# Patient Record
Sex: Male | Born: 1975
Health system: Southern US, Community
[De-identification: ages and names within clinical notes are randomized; demographics above are authoritative.]

## PROBLEM LIST (undated history)

## (undated) DIAGNOSIS — F32A Depression, unspecified: Secondary | ICD-10-CM

## (undated) DIAGNOSIS — G2581 Restless legs syndrome: Secondary | ICD-10-CM

## (undated) DIAGNOSIS — D497 Neoplasm of unspecified behavior of endocrine glands and other parts of nervous system: Secondary | ICD-10-CM

## (undated) DIAGNOSIS — E785 Hyperlipidemia, unspecified: Secondary | ICD-10-CM

## (undated) DIAGNOSIS — D352 Benign neoplasm of pituitary gland: Secondary | ICD-10-CM

## (undated) DIAGNOSIS — F329 Major depressive disorder, single episode, unspecified: Secondary | ICD-10-CM

## (undated) DIAGNOSIS — R000 Tachycardia, unspecified: Secondary | ICD-10-CM

## (undated) DIAGNOSIS — E349 Endocrine disorder, unspecified: Secondary | ICD-10-CM

## (undated) DIAGNOSIS — F324 Major depressive disorder, single episode, in partial remission: Secondary | ICD-10-CM

## (undated) HISTORY — DX: Hyperlipidemia, unspecified: E78.5

## (undated) HISTORY — DX: Major depressive disorder, single episode, in partial remission: F32.4

## (undated) HISTORY — DX: Major depressive disorder, single episode, unspecified: F32.9

## (undated) HISTORY — DX: Depression, unspecified: F32.A

## (undated) HISTORY — DX: Neoplasm of unspecified behavior of endocrine glands and other parts of nervous system: D49.7

## (undated) HISTORY — DX: Benign neoplasm of pituitary gland: D35.2

## (undated) HISTORY — DX: Restless legs syndrome: G25.81

## (undated) HISTORY — DX: Endocrine disorder, unspecified: E34.9

## (undated) HISTORY — PX: ANTERIOR CRUCIATE LIGAMENT REPAIR: SHX115

## (undated) HISTORY — DX: Tachycardia, unspecified: R00.0

## (undated) HISTORY — PX: LAPAROSCOPIC GASTRIC BANDING: SHX1100

## (undated) HISTORY — PX: TENDON REPAIR: SHX5111

---

## 2009-03-29 ENCOUNTER — Emergency Department: Payer: Self-pay | Admitting: Emergency Medicine

## 2009-04-08 ENCOUNTER — Ambulatory Visit: Payer: Self-pay | Admitting: Urology

## 2009-06-03 ENCOUNTER — Ambulatory Visit: Payer: Self-pay | Admitting: Urology

## 2010-02-27 ENCOUNTER — Emergency Department: Payer: Self-pay | Admitting: Emergency Medicine

## 2014-04-14 ENCOUNTER — Ambulatory Visit: Payer: Self-pay

## 2014-04-23 ENCOUNTER — Ambulatory Visit: Payer: Self-pay | Admitting: Physician Assistant

## 2014-06-04 DIAGNOSIS — D352 Benign neoplasm of pituitary gland: Secondary | ICD-10-CM | POA: Insufficient documentation

## 2014-06-04 HISTORY — DX: Benign neoplasm of pituitary gland: D35.2

## 2014-10-07 DIAGNOSIS — F329 Major depressive disorder, single episode, unspecified: Secondary | ICD-10-CM | POA: Insufficient documentation

## 2014-10-07 DIAGNOSIS — F32A Depression, unspecified: Secondary | ICD-10-CM | POA: Insufficient documentation

## 2014-11-26 DIAGNOSIS — R000 Tachycardia, unspecified: Secondary | ICD-10-CM | POA: Insufficient documentation

## 2014-11-26 HISTORY — DX: Tachycardia, unspecified: R00.0

## 2015-03-31 ENCOUNTER — Ambulatory Visit (INDEPENDENT_AMBULATORY_CARE_PROVIDER_SITE_OTHER): Payer: Managed Care, Other (non HMO) | Admitting: Family Medicine

## 2015-03-31 ENCOUNTER — Encounter: Payer: Self-pay | Admitting: Family Medicine

## 2015-03-31 ENCOUNTER — Other Ambulatory Visit: Payer: Self-pay

## 2015-03-31 VITALS — BP 124/84 | HR 88 | Ht 70.0 in | Wt 226.0 lb

## 2015-03-31 DIAGNOSIS — F329 Major depressive disorder, single episode, unspecified: Secondary | ICD-10-CM | POA: Diagnosis not present

## 2015-03-31 DIAGNOSIS — E663 Overweight: Secondary | ICD-10-CM

## 2015-03-31 DIAGNOSIS — I10 Essential (primary) hypertension: Secondary | ICD-10-CM | POA: Diagnosis not present

## 2015-03-31 DIAGNOSIS — Z87442 Personal history of urinary calculi: Secondary | ICD-10-CM

## 2015-03-31 DIAGNOSIS — Z23 Encounter for immunization: Secondary | ICD-10-CM

## 2015-03-31 DIAGNOSIS — Z8249 Family history of ischemic heart disease and other diseases of the circulatory system: Secondary | ICD-10-CM

## 2015-03-31 DIAGNOSIS — E291 Testicular hypofunction: Secondary | ICD-10-CM | POA: Diagnosis not present

## 2015-03-31 DIAGNOSIS — F32A Depression, unspecified: Secondary | ICD-10-CM

## 2015-03-31 DIAGNOSIS — G2581 Restless legs syndrome: Secondary | ICD-10-CM | POA: Insufficient documentation

## 2015-03-31 DIAGNOSIS — D352 Benign neoplasm of pituitary gland: Secondary | ICD-10-CM | POA: Diagnosis not present

## 2015-03-31 DIAGNOSIS — E782 Mixed hyperlipidemia: Secondary | ICD-10-CM | POA: Insufficient documentation

## 2015-03-31 DIAGNOSIS — E669 Obesity, unspecified: Secondary | ICD-10-CM | POA: Insufficient documentation

## 2015-03-31 DIAGNOSIS — E785 Hyperlipidemia, unspecified: Secondary | ICD-10-CM

## 2015-03-31 DIAGNOSIS — E349 Endocrine disorder, unspecified: Secondary | ICD-10-CM

## 2015-03-31 DIAGNOSIS — E66813 Obesity, class 3: Secondary | ICD-10-CM | POA: Insufficient documentation

## 2015-03-31 MED ORDER — SERTRALINE HCL 50 MG PO TABS: 50.0000 mg | ORAL_TABLET | Freq: Every day | ORAL | Status: DC

## 2015-03-31 NOTE — Progress Notes (Signed)
Date:  03/31/2015   Name:  Kyle Ortega.   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 Dx'd with pituitary microadenoma 05/2014 associated with hypotestosteronism only, monitoring only recommended, needs new endo due to insurance. On testosterone injections weekly with good levels 6 months ago. Severe RLS genrally well controlled with Mirapex but takes Lunesta prn when can't sleep. On Lipitor for yrs for HLD, last lipids one year ago. Hx depression on Wellbutrin for 1 yr and Zoloft for 3 months with good control, [facility] psych also not taking new insurance. Last tetanus 5 yrs ago. Had recent negative cardiac w/u for tachycardia. Hx kidney stones, none since switching off tap water. Reports some recent diarrhea he relates to stress, no recent abx exposure. Father took life due to severe CAD/PAD/depression.  Review of Systems:  Review of Systems  Constitutional: Negative for fever and chills.  Respiratory: Negative for shortness of breath.   Cardiovascular: Negative for chest pain and leg swelling.  Gastrointestinal: Negative for abdominal pain, blood in stool and abdominal distention.  Genitourinary: Negative for difficulty urinating.    Patient Active Problem List   Diagnosis Date Noted  . Hypotestosteronism 03/31/2015  . Overweight (BMI 25.0-29.9) 03/31/2015  . Restless leg syndrome 03/31/2015  . Hypertension 03/31/2015  . Hyperlipidemia 03/31/2015  . FH: heart disease 03/31/2015  . Family history of peripheral arterial disease 03/31/2015  . Clinical depression 10/07/2014  . Pituitary microadenoma ([place]) 06/04/2014    Prior to Admission medications   Medication Sig Start Date End Date Taking? Authorizing Provider  aspirin 81 MG chewable tablet Chew 1 tablet by  mouth daily. 10/21/09  Yes Historical Provider, MD  atorvastatin (LIPITOR) 40 MG tablet Take 1 tablet by mouth daily. 01/22/15  Yes Historical Provider, MD  benazepril-hydrochlorthiazide (LOTENSIN HCT) 20-25 MG tablet Take 2 tablets by mouth daily. 04/19/14  Yes Historical Provider, MD  buPROPion (WELLBUTRIN XL) 150 MG 24 hr tablet Take 2 tablets by mouth every morning.  03/02/15  Yes Historical Provider, MD  eszopiclone (LUNESTA) 2 MG TABS tablet Take 1 tablet by mouth at bedtime. As needed 03/02/15  Yes Historical Provider, MD  pramipexole (MIRAPEX) 1 MG tablet Take 1 tablet by mouth 2 (two) times daily. 01/22/15  Yes Historical Provider, MD  sertraline (ZOLOFT) 50 MG tablet Take 1 tablet (50 mg total) by mouth daily. 03/31/15  Yes Adline Potter, MD  testosterone cypionate (DEPOTESTOSTERONE CYPIONATE) 200 MG/ML injection Inject 0.5 mLs into the muscle once a week. 01/22/15  Yes Historical Provider, MD    Allergies  Allergen Reactions  . Shellfish Allergy   . Codeine Nausea Only    Past Surgical History  Procedure Laterality Date  . Laparoscopic gastric banding    . Anterior cruciate ligament repair Left   . Tendon repair Right     thumb    Social History  Substance Use Topics  . Smoking status: Former Research scientist (life sciences)  . Smokeless tobacco: None  . Alcohol Use: 0.0 oz/week    0 Standard drinks or equivalent per week    Family History  Problem Relation Age of Onset  . Diabetes Father   . Heart disease Father   . Stroke Father   . Hypertension Father     Medication list has been reviewed  and updated.  Physical Examination: BP 124/84 mmHg  Pulse 88  Ht 5\' 10"  (1.778 m)  Wt 226 lb (102.513 kg)  BMI 32.43 kg/m2  Physical Exam  Constitutional: He appears well-developed and well-nourished. No distress.  HENT:  Head: Normocephalic and atraumatic.  Right Ear: External ear normal.  Left Ear: External ear normal.  Nose: Nose normal.  Mouth/Throat: Oropharynx is clear and moist.  B TM's  clear  Eyes: Conjunctivae and EOM are normal. No scleral icterus.  Neck: Neck supple. No thyromegaly present.  Cardiovascular: Normal rate, regular rhythm and normal heart sounds.   Pulmonary/Chest: Effort normal and breath sounds normal.  Abdominal: Soft. He exhibits no distension and no mass. There is no tenderness.  Genitourinary: Penis normal.  Testes normal  Musculoskeletal: He exhibits no edema.  Lymphadenopathy:    He has no cervical adenopathy.  Neurological: He is alert. Coordination normal.  Strength 5/5 throughout  Skin: Skin is warm and dry. No rash noted.  Psychiatric: He has a normal mood and affect. His behavior is normal.    Assessment and Plan:  1. Essential hypertension Well controlled, continue current regimen - Comprehensive metabolic panel - CBC  2. Overweight (BMI 25.0-29.9) - Vitamin D (25 hydroxy)  3. Restless leg syndrome Generally well controlled, continue Mirapex  4. Pituitary microadenoma ([place]) Needs endo referral for monitoring - Ambulatory referral to Endocrinology  5. Hypotestosteronism Well controlled on weekly injections - Testosterone - Ambulatory referral to Endocrinology  6. Clinical depression Improved control with addition of Zoloft to Wellbutrin, consider psych referral if control worsens - B12 - TSH - sertraline (ZOLOFT) 50 MG tablet; Take 1 tablet (50 mg total) by mouth daily.  Dispense: 90 tablet; Refill: 3  7. Hyperlipidemia Unclear control on Lipitor - Lipid Profile  8. Need for influenza vaccination - Flu Vaccine QUAD 36+ mos PF IM (Fluarix & Fluzone Quad PF)  9. FH: heart disease Continue daily asa  10. Family history of peripheral arterial disease   Return in about 4 weeks (around 04/28/2015).  Satira Anis. Kyle Ortega, [facility]  03/31/2015

## 2015-03-31 NOTE — Addendum Note (Signed)
Addended by: Adline Potter on: 03/31/2015 10:27 AM   Modules accepted: Orders, SmartSet

## 2015-04-01 LAB — CBC
HEMATOCRIT: 50.4 % (ref 37.5–51.0)
HEMOGLOBIN: 16.8 g/dL (ref 12.6–17.7)
MCH: 27.3 pg (ref 26.6–33.0)
MCHC: 33.3 g/dL (ref 31.5–35.7)
MCV: 82 fL (ref 79–97)
Platelets: 275 10*3/uL (ref 150–379)
RBC: 6.15 x10E6/uL — ABNORMAL HIGH (ref 4.14–5.80)
RDW: 15 % (ref 12.3–15.4)
WBC: 9.5 10*3/uL (ref 3.4–10.8)

## 2015-04-01 LAB — COMPREHENSIVE METABOLIC PANEL
A/G RATIO: 2.1 (ref 1.1–2.5)
ALT: 39 IU/L (ref 0–44)
AST: 18 IU/L (ref 0–40)
Albumin: 4.9 g/dL (ref 3.5–5.5)
Alkaline Phosphatase: 101 IU/L (ref 39–117)
BUN/Creatinine Ratio: 16 (ref 9–20)
BUN: 14 mg/dL (ref 6–24)
Bilirubin Total: 0.3 mg/dL (ref 0.0–1.2)
CALCIUM: 10.3 mg/dL — AB (ref 8.7–10.2)
CO2: 27 mmol/L (ref 18–29)
CREATININE: 0.9 mg/dL (ref 0.76–1.27)
Chloride: 96 mmol/L — ABNORMAL LOW (ref 97–108)
GFR, EST AFRICAN AMERICAN: 122 mL/min/{1.73_m2} (ref >59)
GFR, EST NON AFRICAN AMERICAN: 106 mL/min/{1.73_m2} (ref >59)
GLUCOSE: 76 mg/dL (ref 65–99)
Globulin, Total: 2.3 g/dL (ref 1.5–4.5)
Potassium: 4.5 mmol/L (ref 3.5–5.2)
Sodium: 140 mmol/L (ref 134–144)
TOTAL PROTEIN: 7.2 g/dL (ref 6.0–8.5)

## 2015-04-01 LAB — LIPID PANEL
CHOLESTEROL TOTAL: 202 mg/dL — AB (ref 100–199)
Chol/HDL Ratio: 4.1 ratio units (ref 0.0–5.0)
HDL: 49 mg/dL (ref >39)
LDL Calculated: 131 mg/dL — ABNORMAL HIGH (ref 0–99)
TRIGLYCERIDES: 112 mg/dL (ref 0–149)
VLDL Cholesterol Cal: 22 mg/dL (ref 5–40)

## 2015-04-01 LAB — VITAMIN B12: Vitamin B-12: 814 pg/mL (ref 211–946)

## 2015-04-01 LAB — TESTOSTERONE: TESTOSTERONE: 335 ng/dL — AB (ref 348–1197)

## 2015-04-01 LAB — VITAMIN D 25 HYDROXY (VIT D DEFICIENCY, FRACTURES): Vit D, 25-Hydroxy: 35.5 ng/mL (ref 30.0–100.0)

## 2015-04-01 LAB — TSH: TSH: 0.886 u[IU]/mL (ref 0.450–4.500)

## 2015-04-28 ENCOUNTER — Ambulatory Visit: Payer: Managed Care, Other (non HMO) | Admitting: Family Medicine

## 2015-05-03 ENCOUNTER — Ambulatory Visit (INDEPENDENT_AMBULATORY_CARE_PROVIDER_SITE_OTHER): Payer: Managed Care, Other (non HMO) | Admitting: Family Medicine

## 2015-05-03 ENCOUNTER — Encounter: Payer: Self-pay | Admitting: Family Medicine

## 2015-05-03 VITALS — BP 120/76 | HR 84 | Ht 70.0 in | Wt 242.0 lb

## 2015-05-03 DIAGNOSIS — F32A Depression, unspecified: Secondary | ICD-10-CM

## 2015-05-03 DIAGNOSIS — E291 Testicular hypofunction: Secondary | ICD-10-CM | POA: Diagnosis not present

## 2015-05-03 DIAGNOSIS — E669 Obesity, unspecified: Secondary | ICD-10-CM

## 2015-05-03 DIAGNOSIS — I10 Essential (primary) hypertension: Secondary | ICD-10-CM

## 2015-05-03 DIAGNOSIS — D352 Benign neoplasm of pituitary gland: Secondary | ICD-10-CM

## 2015-05-03 DIAGNOSIS — G2581 Restless legs syndrome: Secondary | ICD-10-CM

## 2015-05-03 DIAGNOSIS — E349 Endocrine disorder, unspecified: Secondary | ICD-10-CM

## 2015-05-03 DIAGNOSIS — F329 Major depressive disorder, single episode, unspecified: Secondary | ICD-10-CM

## 2015-05-03 DIAGNOSIS — E785 Hyperlipidemia, unspecified: Secondary | ICD-10-CM | POA: Diagnosis not present

## 2015-05-03 MED ORDER — BENAZEPRIL-HYDROCHLOROTHIAZIDE 20-25 MG PO TABS: 1.0000 | ORAL_TABLET | Freq: Every day | ORAL | Status: DC

## 2015-05-03 NOTE — Progress Notes (Signed)
Date:  05/03/2015   Name:  Kyle Ortega [facility].   DOB:  02-14-1976   MRN:  [id number]  PCP:  Adline Potter, MD    Chief Complaint: No chief complaint on file.   History of Present Illness:  This is a 39 y.o. male for f/u form initial visit 1 month ago. No new concerns. Has seen endo who increased testosterone to 125 mg weekly. GI symptoms have resolved. Needs refill BP med.  Review of Systems:  Review of Systems  Constitutional: Negative for fever.  Respiratory: Negative for shortness of breath.   Cardiovascular: Negative for chest pain and leg swelling.    Patient Active Problem List   Diagnosis Date Noted  . Hypotestosteronism 03/31/2015  . Overweight (BMI 25.0-29.9) 03/31/2015  . Restless leg syndrome 03/31/2015  . Hypertension 03/31/2015  . Hyperlipidemia 03/31/2015  . FH: heart disease 03/31/2015  . Family history of peripheral arterial disease 03/31/2015  . History of nephrolithiasis 03/31/2015  . Clinical depression 10/07/2014  . Pituitary microadenoma ([place]) 06/04/2014    Prior to Admission medications   Medication Sig Start Date End Date Taking? Authorizing Provider  aspirin 81 MG chewable tablet Chew 1 tablet by mouth daily. 10/21/09  Yes Historical Provider, MD  atorvastatin (LIPITOR) 40 MG tablet Take 1 tablet by mouth daily. 01/22/15  Yes Historical Provider, MD  benazepril-hydrochlorthiazide (LOTENSIN HCT) 20-25 MG tablet Take 1 tablet by mouth daily. 05/03/15  Yes Adline Potter, MD  buPROPion (WELLBUTRIN XL) 150 MG 24 hr tablet Take 2 tablets by mouth every morning.  03/02/15  Yes Historical Provider, MD  eszopiclone (LUNESTA) 2 MG TABS tablet Take 1 tablet by mouth at bedtime. As needed 03/02/15  Yes Historical Provider, MD  pramipexole (MIRAPEX) 1 MG tablet Take 1 tablet by mouth 2 (two) times daily. 01/22/15  Yes Historical Provider, MD  sertraline (ZOLOFT) 50 MG tablet Take 1 tablet (50 mg total) by mouth daily. 03/31/15  Yes Adline Potter, MD  testosterone  cypionate (DEPOTESTOSTERONE CYPIONATE) 200 MG/ML injection Inject 0.6 mLs into the muscle once a week. 01/22/15  Yes Historical Provider, MD    Allergies  Allergen Reactions  . Shellfish Allergy   . Codeine Nausea Only    Past Surgical History  Procedure Laterality Date  . Laparoscopic gastric banding    . Anterior cruciate ligament repair Left   . Tendon repair Right     thumb    Social History  Substance Use Topics  . Smoking status: Former Research scientist (life sciences)  . Smokeless tobacco: None  . Alcohol Use: 0.0 oz/week    0 Standard drinks or equivalent per week    Family History  Problem Relation Age of Onset  . Diabetes Father   . Heart disease Father   . Stroke Father   . Hypertension Father     Medication list has been reviewed and updated.  Physical Examination: BP 120/76 mmHg  Pulse 84  Ht 5\' 10"  (1.778 m)  Wt 242 lb (109.77 kg)  BMI 34.72 kg/m2  Physical Exam  Constitutional: He appears well-developed and well-nourished.  Cardiovascular: Normal rate, regular rhythm and normal heart sounds.   Pulmonary/Chest: Effort normal and breath sounds normal.  Musculoskeletal: He exhibits no edema.  Neurological: He is alert.  Skin: Skin is warm and dry.  Psychiatric: He has a normal mood and affect. His behavior is normal.  Nursing note and vitals reviewed.   Assessment and Plan:  1. Essential hypertension Well controlled on current regimen, refill (system says 2  tablets daily but suspect appropriate dose 1 tablet daily) - benazepril-hydrochlorthiazide (LOTENSIN HCT) 20-25 MG tablet; Take 1 tablet by mouth daily.  Dispense: 90 tablet; Refill: 3  2. Clinical depression Well controlled on current regimen  3. Obesity Continue exercise/weight loss  4. Restless leg syndrome Well controlled on Mirapex  5. Hyperlipidemia Well controlled on Lipitor  6. Hypotestosteronism Testosterone dose increased by endo  7. Pituitary microadenoma ([place]) Followed by endo   Return in  about 6 months (around 10/31/2015).  Satira Anis. Macil Crady, [facility]  05/03/2015

## 2015-05-04 ENCOUNTER — Ambulatory Visit: Payer: Managed Care, Other (non HMO) | Admitting: Family Medicine

## 2015-11-03 ENCOUNTER — Encounter: Payer: Self-pay | Admitting: Family Medicine

## 2015-11-03 ENCOUNTER — Ambulatory Visit (INDEPENDENT_AMBULATORY_CARE_PROVIDER_SITE_OTHER): Payer: Managed Care, Other (non HMO) | Admitting: Family Medicine

## 2015-11-03 VITALS — BP 108/84 | HR 83 | Resp 16 | Ht 71.0 in | Wt 237.2 lb

## 2015-11-03 DIAGNOSIS — R0683 Snoring: Secondary | ICD-10-CM | POA: Diagnosis not present

## 2015-11-03 DIAGNOSIS — E785 Hyperlipidemia, unspecified: Secondary | ICD-10-CM

## 2015-11-03 DIAGNOSIS — E663 Overweight: Secondary | ICD-10-CM | POA: Diagnosis not present

## 2015-11-03 DIAGNOSIS — I10 Essential (primary) hypertension: Secondary | ICD-10-CM

## 2015-11-03 DIAGNOSIS — F329 Major depressive disorder, single episode, unspecified: Secondary | ICD-10-CM

## 2015-11-03 DIAGNOSIS — E291 Testicular hypofunction: Secondary | ICD-10-CM | POA: Diagnosis not present

## 2015-11-03 DIAGNOSIS — G2581 Restless legs syndrome: Secondary | ICD-10-CM | POA: Diagnosis not present

## 2015-11-03 DIAGNOSIS — E349 Endocrine disorder, unspecified: Secondary | ICD-10-CM

## 2015-11-03 DIAGNOSIS — F32A Depression, unspecified: Secondary | ICD-10-CM

## 2015-11-03 MED ORDER — GABAPENTIN 100 MG PO CAPS: ORAL_CAPSULE | ORAL | Status: DC

## 2015-11-03 NOTE — Progress Notes (Signed)
Date:  11/03/2015   Name:  Kyle Ortega.   DOB:  03-10-1976   MRN:  [id number]  PCP:  Adline Potter, MD    Chief Complaint: Hypertension   History of Present Illness:  This is a 40 y.o. male seen in six month f/u. Taking two Lotensin HCT tabs daily. Depression well controlled on Zoloft and Wellbutrin and wonders about stopping one. Weight down 5#, exercising more. RLS is bothering a lot, thinks may be related to eating gluten at night, Mirapex causes nausea, would like to try something else. On testosterone replacement per endo but doesn't think is helping, did not improve fatigue and sexual function never an issue, interested in stopping. Has been snoring more, had home sleep study through dentist that showed mild apnea but no daytime sleepiness.  Review of Systems:  Review of Systems  Constitutional: Negative for fever and fatigue.  Respiratory: Negative for cough and shortness of breath.   Cardiovascular: Negative for chest pain and leg swelling.  Endocrine: Negative for polyuria.  Genitourinary: Negative for difficulty urinating.  Neurological: Negative for syncope and light-headedness.    Patient Active Problem List   Diagnosis Date Noted  . Snoring 11/03/2015  . Hypotestosteronism 03/31/2015  . Overweight (BMI 25.0-29.9) 03/31/2015  . Restless leg syndrome 03/31/2015  . Hypertension 03/31/2015  . Hyperlipidemia 03/31/2015  . FH: heart disease 03/31/2015  . Family history of peripheral arterial disease 03/31/2015  . History of nephrolithiasis 03/31/2015  . Fast heart beat 11/26/2014  . Clinical depression 10/07/2014  . Pituitary microadenoma ([place]) 06/04/2014    Prior to Admission medications   Medication Sig Start Date End Date Taking? Authorizing Provider  aspirin 81 MG chewable tablet Chew 1 tablet by mouth daily. 10/21/09  Yes Historical Provider, MD  atorvastatin (LIPITOR) 40 MG tablet Take 1 tablet by mouth daily. 01/22/15  Yes Historical Provider, MD   benazepril-hydrochlorthiazide (LOTENSIN HCT) 20-25 MG tablet Take 1 tablet by mouth daily. 05/03/15  Yes Adline Potter, MD  pramipexole (MIRAPEX) 1 MG tablet Take 1 tablet by mouth 2 (two) times daily. 01/22/15  Yes Historical Provider, MD  sertraline (ZOLOFT) 50 MG tablet Take 1 tablet (50 mg total) by mouth daily. 03/31/15  Yes Adline Potter, MD  gabapentin (NEURONTIN) 100 MG capsule Take 1-3 capsules daily at bedtime 11/03/15   Adline Potter, MD    Allergies  Allergen Reactions  . Shellfish Allergy   . Codeine Nausea Only    Past Surgical History  Procedure Laterality Date  . Laparoscopic gastric banding    . Anterior cruciate ligament repair Left   . Tendon repair Right     thumb    Social History  Substance Use Topics  . Smoking status: Former Research scientist (life sciences)  . Smokeless tobacco: None  . Alcohol Use: 0.0 oz/week    0 Standard drinks or equivalent per week    Family History  Problem Relation Age of Onset  . Diabetes Father   . Heart disease Father   . Stroke Father   . Hypertension Father     Medication list has been reviewed and updated.  Physical Examination: BP 108/84 mmHg  Pulse 83  Resp 16  Ht 5\' 11"  (1.803 m)  Wt 237 lb 3.2 oz (107.593 kg)  BMI 33.10 kg/m2  SpO2 98%  Physical Exam  Constitutional: He appears well-developed and well-nourished.  Cardiovascular: Normal rate, regular rhythm and normal heart sounds.   Pulmonary/Chest: Effort normal and breath sounds normal.  Musculoskeletal: He exhibits no edema.  Neurological: He is alert.  Skin: Skin is warm and dry.  Psychiatric: He has a normal mood and affect. His behavior is normal.  Nursing note and vitals reviewed.   Assessment and Plan:  1. Essential hypertension Overcontrolled, decrease Lotensin HCT to 1 tablet daily  2. Clinical depression Well controlled on Zoloft/Wellbutrin x 6 months, d/c Wellbutrin, consider increased Zoloft dose if sxs recur  3. Hypotestosteronism On low dose replacement,  not helping, recommend d/c  4. Overweight (BMI 25.0-29.9) Weight down 5#, encouraged continued exercise/weight loss  5. Restless leg syndrome Poor control on Mirapex, trial gabapentin 100-300 mg qhs, may also try avoiding gluten in evenings  6. Hyperlipidemia Well controlled on Lipitor  7. Snoring Discussed formal sleep study, will defer for now as no severe OSA sxs  Return in about 6 months (around 05/05/2016).  Satira Anis. [facility]  11/03/2015

## 2015-11-25 ENCOUNTER — Encounter: Payer: Self-pay | Admitting: Family Medicine

## 2015-11-29 ENCOUNTER — Other Ambulatory Visit: Payer: Self-pay | Admitting: Family Medicine

## 2015-11-29 MED ORDER — PRAMIPEXOLE DIHYDROCHLORIDE 0.5 MG PO TABS: 0.5000 mg | ORAL_TABLET | Freq: Every day | ORAL | Status: DC

## 2016-03-15 ENCOUNTER — Ambulatory Visit
Admission: EM | Admit: 2016-03-15 | Discharge: 2016-03-15 | Disposition: A | Discharge status: Home / Self Care | Payer: Managed Care, Other (non HMO) | Attending: Family Medicine | Admitting: Family Medicine

## 2016-03-15 DIAGNOSIS — T22111A Burn of first degree of right forearm, initial encounter: Secondary | ICD-10-CM

## 2016-03-15 DIAGNOSIS — T22211A Burn of second degree of right forearm, initial encounter: Secondary | ICD-10-CM | POA: Diagnosis not present

## 2016-03-15 MED ORDER — SILVER SULFADIAZINE 1 % EX CREA
TOPICAL_CREAM | Freq: Once | CUTANEOUS | Status: AC
  Administered 2016-03-15: 17:00:00 via TOPICAL

## 2016-03-15 MED ORDER — SILVER SULFADIAZINE 1 % EX CREA: 1.0000 "application " | TOPICAL_CREAM | Freq: Two times a day (BID) | CUTANEOUS | 0 refills | Status: AC

## 2016-03-15 MED ORDER — OXYCODONE-ACETAMINOPHEN 5-325 MG PO TABS: 1.0000 | ORAL_TABLET | Freq: Three times a day (TID) | ORAL | 0 refills | Status: DC | PRN
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
## 2016-03-15 NOTE — Discharge Instructions (Signed)
Take medication as prescribed. Drink plenty of fluids. Keep clean.   Follow up with your primary care physician or [facility] Burn clinic in 2-3 days for close wound follow up. Call today.  Return to Urgent care for new or worsening concerns.   [facility]  [address] [place] [id number] [place], [place] [address] Appointments  Phone: [phone]
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
## 2016-03-24 ENCOUNTER — Other Ambulatory Visit: Payer: Self-pay | Admitting: Internal Medicine

## 2016-03-24 MED ORDER — PRAMIPEXOLE DIHYDROCHLORIDE 0.5 MG PO TABS: 0.5000 mg | ORAL_TABLET | Freq: Every day | ORAL | 1 refills | Status: DC

## 2016-04-03 ENCOUNTER — Other Ambulatory Visit: Payer: Self-pay | Admitting: Internal Medicine

## 2016-04-03 ENCOUNTER — Encounter: Payer: Self-pay | Admitting: Internal Medicine

## 2016-04-03 DIAGNOSIS — S83411S Sprain of medial collateral ligament of right knee, sequela: Secondary | ICD-10-CM

## 2016-04-04 ENCOUNTER — Encounter: Payer: Self-pay | Admitting: Internal Medicine

## 2016-04-04 ENCOUNTER — Ambulatory Visit (INDEPENDENT_AMBULATORY_CARE_PROVIDER_SITE_OTHER): Payer: Managed Care, Other (non HMO) | Admitting: Internal Medicine

## 2016-04-04 ENCOUNTER — Ambulatory Visit: Payer: Managed Care, Other (non HMO) | Admitting: Internal Medicine

## 2016-04-04 VITALS — BP 120/84 | HR 64 | Ht 70.0 in | Wt 248.0 lb

## 2016-04-04 DIAGNOSIS — Z23 Encounter for immunization: Secondary | ICD-10-CM | POA: Diagnosis not present

## 2016-04-04 DIAGNOSIS — I10 Essential (primary) hypertension: Secondary | ICD-10-CM

## 2016-04-04 DIAGNOSIS — G4700 Insomnia, unspecified: Secondary | ICD-10-CM | POA: Diagnosis not present

## 2016-04-04 DIAGNOSIS — E782 Mixed hyperlipidemia: Secondary | ICD-10-CM | POA: Diagnosis not present

## 2016-04-04 DIAGNOSIS — F324 Major depressive disorder, single episode, in partial remission: Secondary | ICD-10-CM

## 2016-04-04 DIAGNOSIS — G2581 Restless legs syndrome: Secondary | ICD-10-CM

## 2016-04-04 HISTORY — DX: Major depressive disorder, single episode, in partial remission: F32.4

## 2016-04-04 MED ORDER — PRAMIPEXOLE DIHYDROCHLORIDE 1 MG PO TABS: 1.0000 mg | ORAL_TABLET | Freq: Every day | ORAL | 1 refills | Status: DC

## 2016-04-04 MED ORDER — SUVOREXANT 10 MG PO TABS: 10.0000 mg | ORAL_TABLET | Freq: Every day | ORAL | 0 refills | Status: DC

## 2016-04-04 NOTE — Progress Notes (Signed)
Date:  04/04/2016   Name:  Drinda Butts [facility].   DOB:  Jul 09, 1975   MRN:  [id number]   Chief Complaint: Follow-up (Dr. Vicente Masson Pt- to discuss Mirapex)  Hypertension  This is a chronic problem. The current episode started more than 1 month ago. The problem is unchanged. The problem is controlled. Pertinent negatives include no chest pain, palpitations or shortness of breath.    RLS - has tried gabapentin but seemed to aggravate sx. Miripex 2 mg seems to work well.   Requip caused nausea.  He is anxious about taking higher doses of miripex and wonders about adding a sleeping pill.  Insomnia - has had bad reaction to Ambien and does not like Lunesta.  He has poor sleep - aggravated by RLS.  He is currently using Unisom.  Review of Systems  Constitutional: Negative for chills, fatigue and fever.  Respiratory: Negative for cough, chest tightness and shortness of breath.   Cardiovascular: Negative for chest pain, palpitations and leg swelling.  Gastrointestinal: Negative for abdominal pain.  Musculoskeletal: Positive for myalgias (RLS in legs and arms). Negative for arthralgias.  Skin: Negative for rash.  Neurological: Negative for tremors, seizures and numbness.  Psychiatric/Behavioral: Positive for sleep disturbance. Negative for dysphoric mood.    Patient Active Problem List   Diagnosis Date Noted  . Major depressive disorder with single episode, in partial remission ([place]) 04/04/2016  . Complete tear of MCL of knee, right, sequela 04/03/2016  . Snoring 11/03/2015  . Hypotestosteronism 03/31/2015  . Overweight (BMI 25.0-29.9) 03/31/2015  . Restless leg syndrome 03/31/2015  . Essential hypertension 03/31/2015  . Mixed hyperlipidemia 03/31/2015  . FH: heart disease 03/31/2015  . Family history of peripheral arterial disease 03/31/2015  . History of nephrolithiasis 03/31/2015  . Tachycardia 11/26/2014  . Pituitary microadenoma ([place]) 06/04/2014    Prior to Admission medications    Medication Sig Start Date End Date Taking? Authorizing Provider  aspirin 81 MG chewable tablet Chew 1 tablet by mouth daily. 10/21/09  Yes Historical Provider, MD  atorvastatin (LIPITOR) 40 MG tablet Take 1 tablet by mouth daily. 01/22/15  Yes Historical Provider, MD  benazepril-hydrochlorthiazide (LOTENSIN HCT) 20-25 MG tablet Take 1 tablet by mouth daily. 05/03/15  Yes Adline Potter, MD  pramipexole (MIRAPEX) 0.5 MG tablet Take 1 tablet (0.5 mg total) by mouth at bedtime. 03/24/16  Yes Adline Potter, MD  sertraline (ZOLOFT) 50 MG tablet Take 1 tablet (50 mg total) by mouth daily. 03/31/15  Yes Adline Potter, MD  oxyCODONE-acetaminophen (PERCOCET/ROXICET) 5-325 MG tablet TAKE 1 TABLET BY MOUTH EVERY 8 HOURS AS NEEDED FOR MODERATE PAIN 03/15/16   Historical Provider, MD  testosterone cypionate (DEPOTESTOSTERONE CYPIONATE) 200 MG/ML injection Inject into the muscle. 04/15/15   Historical Provider, MD    Allergies  Allergen Reactions  . Shellfish Allergy   . Codeine Nausea Only    Past Surgical History:  Procedure Laterality Date  . ANTERIOR CRUCIATE LIGAMENT REPAIR Left   . LAPAROSCOPIC GASTRIC BANDING    . TENDON REPAIR Right    thumb    Social History  Substance Use Topics  . Smoking status: Former Research scientist (life sciences)  . Smokeless tobacco: Never Used  . Alcohol use 0.0 oz/week     Medication list has been reviewed and updated.   Physical Exam  Constitutional: He is oriented to person, place, and time. He appears well-developed. No distress.  HENT:  Head: Normocephalic and atraumatic.  Neck: Normal range of motion. Carotid bruit is not present.  No thyromegaly present.  Cardiovascular: Normal rate, regular rhythm and normal heart sounds.   Pulmonary/Chest: Effort normal and breath sounds normal. No respiratory distress. He has no wheezes.  Musculoskeletal: He exhibits no edema or tenderness.  Neurological: He is alert and oriented to person, place, and time.  Skin: Skin is warm and dry. No  rash noted.  Psychiatric: He has a normal mood and affect. His behavior is normal. Thought content normal.  Nursing note and vitals reviewed.   BP 120/84   Pulse 64   Ht 5\' 10"  (1.778 m)   Wt 248 lb (112.5 kg)   BMI 35.58 kg/m   Assessment and Plan: 1. Restless leg syndrome Increase mirapex to 1-2 mg hPM - pramipexole (MIRAPEX) 1 MG tablet; Take 1-2 tablets (1-2 mg total) by mouth at bedtime.  Dispense: 180 tablet; Refill: 1  2. Essential hypertension controlled  3. Mixed hyperlipidemia On statin therapy Need labs next visit  4. Insomnia, unspecified type Trial #10 tabs - Suvorexant (BELSOMRA) 10 MG TABS; Take 10 mg by mouth at bedtime.  Dispense: 10 tablet; Refill: 0  5. Major depressive disorder with single episode, in partial remission ([place]) Doing well on Zoloft  6. Need for influenza vaccination - Flu Vaccine QUAD 36+ mos IM   Halina Maidens, MD [facility]  04/04/2016

## 2016-04-13 ENCOUNTER — Encounter: Payer: Self-pay | Admitting: Internal Medicine

## 2016-04-13 ENCOUNTER — Other Ambulatory Visit: Payer: Self-pay | Admitting: Internal Medicine

## 2016-04-13 DIAGNOSIS — G4700 Insomnia, unspecified: Secondary | ICD-10-CM

## 2016-04-13 MED ORDER — SUVOREXANT 10 MG PO TABS: 10.0000 mg | ORAL_TABLET | Freq: Every day | ORAL | 5 refills | Status: DC

## 2016-04-17 ENCOUNTER — Other Ambulatory Visit: Payer: Self-pay | Admitting: Internal Medicine

## 2016-04-17 MED ORDER — SERTRALINE HCL 50 MG PO TABS: 50.0000 mg | ORAL_TABLET | Freq: Every day | ORAL | 1 refills | Status: DC

## 2016-05-05 ENCOUNTER — Ambulatory Visit: Payer: Managed Care, Other (non HMO) | Admitting: Family Medicine

## 2016-05-26 ENCOUNTER — Other Ambulatory Visit: Payer: Self-pay | Admitting: Family Medicine

## 2016-05-26 ENCOUNTER — Other Ambulatory Visit: Payer: Self-pay | Admitting: Internal Medicine

## 2016-05-26 ENCOUNTER — Encounter: Payer: Self-pay | Admitting: Internal Medicine

## 2016-05-26 DIAGNOSIS — I10 Essential (primary) hypertension: Secondary | ICD-10-CM

## 2016-05-26 MED ORDER — BENAZEPRIL-HYDROCHLOROTHIAZIDE 20-25 MG PO TABS: 1.0000 | ORAL_TABLET | Freq: Every day | ORAL | 1 refills | Status: DC

## 2016-06-29 ENCOUNTER — Ambulatory Visit: Payer: Managed Care, Other (non HMO) | Admitting: Internal Medicine

## 2016-06-29 ENCOUNTER — Encounter: Payer: Self-pay | Admitting: Family Medicine

## 2016-06-29 ENCOUNTER — Ambulatory Visit (INDEPENDENT_AMBULATORY_CARE_PROVIDER_SITE_OTHER): Payer: Managed Care, Other (non HMO) | Admitting: Family Medicine

## 2016-06-29 VITALS — HR 72 | Temp 99.8°F

## 2016-06-29 DIAGNOSIS — J0100 Acute maxillary sinusitis, unspecified: Secondary | ICD-10-CM | POA: Diagnosis not present

## 2016-06-29 DIAGNOSIS — D352 Benign neoplasm of pituitary gland: Secondary | ICD-10-CM

## 2016-06-29 DIAGNOSIS — E291 Testicular hypofunction: Secondary | ICD-10-CM | POA: Diagnosis not present

## 2016-06-29 MED ORDER — AZITHROMYCIN 250 MG PO TABS: ORAL_TABLET | ORAL | 0 refills | Status: DC

## 2016-06-29 MED ORDER — GUAIFENESIN-CODEINE 100-10 MG/5ML PO SYRP: 5.0000 mL | ORAL_SOLUTION | Freq: Three times a day (TID) | ORAL | 0 refills | Status: DC | PRN

## 2016-06-29 NOTE — Progress Notes (Signed)
Name: Kyle Ortega [facility].   MRN: [id number]    DOB: 08-24-1975   Date:06/29/2016       Progress Note  Subjective  Chief Complaint  No chief complaint on file.   Patient needs followup for 1) testicular hypofunction and 2) hx (2015) of pituitary microadenoma.    Sinusitis  This is a new problem. The current episode started in the past 7 days. The problem has been waxing and waning since onset. Maximum temperature: low grade. Associated symptoms include congestion, coughing and sinus pressure. Pertinent negatives include no chills, ear pain, headaches, shortness of breath or sore throat. (Cough nonproductive) Past treatments include nothing. The treatment provided no relief.    No problem-specific Assessment & Plan notes found for this encounter.   Past Medical History:  Diagnosis Date  . Depression   . Hyperlipidemia   . Hypotestosteronism   . Pituitary tumor    Dx 05/2014  . Restless leg syndrome     Past Surgical History:  Procedure Laterality Date  . ANTERIOR CRUCIATE LIGAMENT REPAIR Left   . LAPAROSCOPIC GASTRIC BANDING    . TENDON REPAIR Right    thumb    Family History  Problem Relation Age of Onset  . Diabetes Father   . Heart disease Father   . Stroke Father   . Hypertension Father     Social History   Social History  . Marital status: Married    Spouse name: N/A  . Number of children: N/A  . Years of education: N/A   Occupational History  . Not on file.   Social History Main Topics  . Smoking status: Former Research scientist (life sciences)  . Smokeless tobacco: Never Used  . Alcohol use 0.0 oz/week  . Drug use: No  . Sexual activity: Yes   Other Topics Concern  . Not on file   Social History Narrative  . No narrative on file    Allergies  Allergen Reactions  . Requip [Ropinirole] Nausea Only  . Shellfish Allergy   . Codeine Nausea Only     Review of Systems  Constitutional: Positive for malaise/fatigue. Negative for chills and fever.  HENT: Positive for  congestion, sinus pain and sinus pressure. Negative for ear pain and sore throat.   Eyes: Negative for blurred vision.  Respiratory: Positive for cough. Negative for shortness of breath and wheezing.   Genitourinary: Negative for dysuria, frequency, hematuria and urgency.  Musculoskeletal: Positive for myalgias.  Skin: Negative for rash.  Neurological: Negative for headaches.  Psychiatric/Behavioral: The patient does not have insomnia.      Objective  Vitals:   06/29/16 1241  Pulse: 72  Temp: 99.8 F (37.7 C)    Physical Exam  Constitutional: He is oriented to person, place, and time and well-developed, well-nourished, and in no distress.  HENT:  Head: Normocephalic.  Right Ear: External ear normal.  Left Ear: External ear normal.  Nose: Nose normal.  Mouth/Throat: Oropharynx is clear and moist.  Eyes: Right eye visual fields normal and left eye visual fields normal. Conjunctivae, EOM and lids are normal. Pupils are equal, round, and reactive to light.  Neck: Normal range of motion. Neck supple. No JVD present. No tracheal deviation present. No thyromegaly present.  Cardiovascular: Normal rate, regular rhythm, normal heart sounds and intact distal pulses.  Exam reveals no gallop and no friction rub.   No murmur heard. Pulmonary/Chest: Breath sounds normal. No respiratory distress. He has no wheezes. He has no rales.  Abdominal: Soft. Bowel sounds  are normal. There is no hepatosplenomegaly. There is no tenderness. There is no CVA tenderness.  Musculoskeletal: Normal range of motion.  Lymphadenopathy:    He has no cervical adenopathy.  Neurological: He is alert and oriented to person, place, and time. He has normal sensation, normal strength, normal reflexes and intact cranial nerves. No cranial nerve deficit.  Skin: Skin is warm. No rash noted.  Psychiatric: Mood and affect normal.      Assessment & Plan  Problem List Items Addressed This Visit      Endocrine    Pituitary microadenoma [facility])   Testicular hypofunction    Other Visit Diagnoses    Acute maxillary sinusitis, recurrence not specified    -  Primary   Relevant Medications   azithromycin (ZITHROMAX) 250 MG tablet   guaiFENesin-codeine (ROBITUSSIN AC) 100-10 MG/5ML syrup        Dr. Macon [facility]  06/29/16

## 2016-06-30 ENCOUNTER — Ambulatory Visit: Payer: Self-pay | Admitting: Internal Medicine

## 2016-07-02 ENCOUNTER — Telehealth: Payer: Self-pay | Admitting: Internal Medicine

## 2016-07-02 NOTE — Telephone Encounter (Signed)
Please call patient to inform him that his testosterone is still low.  Also prolactin level is low as desired. He should contact Endocrinology about repeat MRI of pituitary gland to see if it is needed.

## 2016-07-03 NOTE — Telephone Encounter (Signed)
Called pt about lab result---testosterone and prolactin still low and Dr. Army Melia instructions.

## 2016-07-31 ENCOUNTER — Ambulatory Visit (INDEPENDENT_AMBULATORY_CARE_PROVIDER_SITE_OTHER): Payer: Managed Care, Other (non HMO) | Admitting: Internal Medicine

## 2016-07-31 ENCOUNTER — Encounter: Payer: Self-pay | Admitting: Internal Medicine

## 2016-07-31 VITALS — BP 122/86 | HR 76 | Temp 97.6°F | Ht 71.0 in | Wt 205.0 lb

## 2016-07-31 DIAGNOSIS — E782 Mixed hyperlipidemia: Secondary | ICD-10-CM | POA: Diagnosis not present

## 2016-07-31 DIAGNOSIS — N529 Male erectile dysfunction, unspecified: Secondary | ICD-10-CM | POA: Diagnosis not present

## 2016-07-31 DIAGNOSIS — I10 Essential (primary) hypertension: Secondary | ICD-10-CM | POA: Diagnosis not present

## 2016-07-31 DIAGNOSIS — D352 Benign neoplasm of pituitary gland: Secondary | ICD-10-CM

## 2016-07-31 MED ORDER — ATORVASTATIN CALCIUM 40 MG PO TABS: 40.0000 mg | ORAL_TABLET | Freq: Every day | ORAL | 3 refills | Status: DC

## 2016-07-31 MED ORDER — SILDENAFIL CITRATE 100 MG PO TABS: 50.0000 mg | ORAL_TABLET | Freq: Every day | ORAL | 11 refills | Status: DC | PRN

## 2016-07-31 NOTE — Patient Instructions (Signed)
[facility]  [address] [place] [place], [place] [address]  [phone]  Sheran Fava, [place]  [facility]  [place], [place] [address]  [phone]  [phone] (Fax)  Hypogonadotropic hypogonadism (Primary Dx);

## 2016-07-31 NOTE — Progress Notes (Signed)
Date:  07/31/2016   Name:  Kyle Ortega [facility].   DOB:  [birth year]   MRN:  [id number]   Chief Complaint: No chief complaint on file. Hyperlipidemia  This is a chronic problem. Recent lipid tests were reviewed and are normal. Pertinent negatives include no chest pain or shortness of breath. Current antihyperlipidemic treatment includes statins. The current treatment provides significant improvement of lipids.  Erectile Dysfunction  This is a new problem. The current episode started more than 1 month ago. The problem is unchanged. The nature of his difficulty is maintaining erection. He reports no anxiety, decreased libido or performance anxiety. Pertinent negatives include no chills. Nothing aggravates the symptoms. Past treatments include nothing. Risk factors: he has low T and wants to get back on treatment.  Low Testosterone - he has a pituitary microadenoma and low T.  He was treated for a while with testosterone but only got his level up to 300.  He did not feel any different so stopped.  Now he has developed ED and thinks he needs to resume supplementation.  He tried to see Endo in [place] but the wait is months.  I reminded him that he saw Endo at [facility] in 2016.  Lab Results  Component Value Date   TESTOSTERONE 335 (L) 03/31/2015   Lab Results  Component Value Date   CHOL 202 (H) 03/31/2015   HDL 49 03/31/2015   LDLCALC 131 (H) 03/31/2015   TRIG 112 03/31/2015   CHOLHDL 4.1 03/31/2015    Review of Systems  Constitutional: Negative for chills and fatigue.  Eyes: Negative for visual disturbance.  Respiratory: Negative for chest tightness, shortness of breath and wheezing.   Cardiovascular: Negative for chest pain and palpitations.  Genitourinary: Negative for decreased libido.       ED  Musculoskeletal: Negative for back pain.  Neurological: Negative for dizziness, seizures, light-headedness and headaches.  Psychiatric/Behavioral: Negative for sleep disturbance. The patient is  not nervous/anxious.     Patient Active Problem List   Diagnosis Date Noted  . Testicular hypofunction 06/29/2016  . Major depressive disorder with single episode, in partial remission ([place]) 04/04/2016  . Complete tear of MCL of knee, right, sequela 04/03/2016  . Snoring 11/03/2015  . Hypotestosteronism 03/31/2015  . Overweight (BMI 25.0-29.9) 03/31/2015  . Restless leg syndrome 03/31/2015  . Essential hypertension 03/31/2015  . Mixed hyperlipidemia 03/31/2015  . FH: heart disease 03/31/2015  . Family history of peripheral arterial disease 03/31/2015  . History of nephrolithiasis 03/31/2015  . Tachycardia 11/26/2014  . Pituitary microadenoma ([place]) 06/04/2014    Prior to Admission medications   Medication Sig Start Date End Date Taking? Authorizing Provider  aspirin 81 MG chewable tablet Chew 1 tablet by mouth daily. 10/21/09  Yes Historical Provider, MD  atorvastatin (LIPITOR) 40 MG tablet Take 1 tablet by mouth daily. 01/22/15  Yes Historical Provider, MD  benazepril-hydrochlorthiazide (LOTENSIN HCT) 20-25 MG tablet Take 1 tablet by mouth daily. 05/26/16  Yes Adline Potter, MD  pramipexole (MIRAPEX) 1 MG tablet Take 1-2 tablets (1-2 mg total) by mouth at bedtime. 04/04/16  Yes Glean Hess, MD  Suvorexant (BELSOMRA) 10 MG TABS Take 10 mg by mouth at bedtime. 04/13/16  Yes Glean Hess, MD    Allergies  Allergen Reactions  . Requip [Ropinirole] Nausea Only  . Shellfish Allergy   . Codeine Nausea Only    Past Surgical History:  Procedure Laterality Date  . ANTERIOR CRUCIATE LIGAMENT REPAIR Left   .  LAPAROSCOPIC GASTRIC BANDING    . TENDON REPAIR Right    thumb    Social History  Substance Use Topics  . Smoking status: Former Research scientist (life sciences)  . Smokeless tobacco: Never Used  . Alcohol use 0.0 oz/week     Medication list has been reviewed and updated.   Physical Exam  Constitutional: He is oriented to person, place, and time. He appears well-developed. No distress.    HENT:  Head: Normocephalic and atraumatic.  Cardiovascular: Normal rate, regular rhythm and normal heart sounds.   Pulmonary/Chest: Effort normal and breath sounds normal. No respiratory distress.  Musculoskeletal: Normal range of motion.  Neurological: He is alert and oriented to person, place, and time.  Skin: Skin is warm and dry. No rash noted.  Psychiatric: He has a normal mood and affect. His behavior is normal. Thought content normal.  Nursing note and vitals reviewed.   BP 122/86   Pulse 76   Temp 97.6 F (36.4 C)   Ht 5\' 11"  (1.803 m)   Wt 205 lb (93 kg)   SpO2 96%   BMI 28.59 kg/m   Assessment and Plan: 1. Essential hypertension controlled  2. Pituitary microadenoma ([place]) Follow up with Dr. Marchelle Folks  3. Mixed hyperlipidemia Continue medication - atorvastatin (LIPITOR) 40 MG tablet; Take 1 tablet (40 mg total) by mouth daily.  Dispense: 90 tablet; Refill: 3  4. Erectile dysfunction, unspecified erectile dysfunction type Begin viagra 25-50 mg dose - sildenafil (VIAGRA) 100 MG tablet; Take 0.5-1 tablets (50-100 mg total) by mouth daily as needed for erectile dysfunction.  Dispense: 6 tablet; Refill: [place], MD [facility]  07/31/2016

## 2016-09-23 ENCOUNTER — Other Ambulatory Visit: Payer: Self-pay | Admitting: Internal Medicine

## 2016-09-23 DIAGNOSIS — G2581 Restless legs syndrome: Secondary | ICD-10-CM

## 2016-11-13 DIAGNOSIS — L738 Other specified follicular disorders: Secondary | ICD-10-CM | POA: Diagnosis not present

## 2016-11-13 DIAGNOSIS — L720 Epidermal cyst: Secondary | ICD-10-CM | POA: Diagnosis not present

## 2016-11-24 DIAGNOSIS — E291 Testicular hypofunction: Secondary | ICD-10-CM | POA: Diagnosis not present

## 2016-11-24 DIAGNOSIS — D352 Benign neoplasm of pituitary gland: Secondary | ICD-10-CM | POA: Diagnosis not present

## 2016-11-29 ENCOUNTER — Telehealth: Payer: Self-pay

## 2016-11-29 NOTE — Telephone Encounter (Signed)
Nate called from Dr. Delorise Shiner office to request last labs to be sent to there office fax for patient. Sent labs and called and confirmed they received them with Nate. He said he had them.

## 2016-12-05 DIAGNOSIS — Z9884 Bariatric surgery status: Secondary | ICD-10-CM | POA: Diagnosis not present

## 2016-12-05 DIAGNOSIS — Z87891 Personal history of nicotine dependence: Secondary | ICD-10-CM | POA: Diagnosis not present

## 2016-12-05 DIAGNOSIS — E291 Testicular hypofunction: Secondary | ICD-10-CM | POA: Diagnosis not present

## 2016-12-05 DIAGNOSIS — Z6833 Body mass index (BMI) 33.0-33.9, adult: Secondary | ICD-10-CM | POA: Diagnosis not present

## 2016-12-05 DIAGNOSIS — Z48815 Encounter for surgical aftercare following surgery on the digestive system: Secondary | ICD-10-CM | POA: Diagnosis not present

## 2016-12-05 DIAGNOSIS — R1310 Dysphagia, unspecified: Secondary | ICD-10-CM | POA: Diagnosis not present

## 2016-12-05 DIAGNOSIS — D352 Benign neoplasm of pituitary gland: Secondary | ICD-10-CM | POA: Diagnosis not present

## 2016-12-05 DIAGNOSIS — K219 Gastro-esophageal reflux disease without esophagitis: Secondary | ICD-10-CM | POA: Diagnosis not present

## 2016-12-05 DIAGNOSIS — E669 Obesity, unspecified: Secondary | ICD-10-CM | POA: Diagnosis not present

## 2016-12-12 DIAGNOSIS — K224 Dyskinesia of esophagus: Secondary | ICD-10-CM | POA: Diagnosis not present

## 2016-12-12 DIAGNOSIS — Z9884 Bariatric surgery status: Secondary | ICD-10-CM | POA: Diagnosis not present

## 2016-12-21 DIAGNOSIS — D352 Benign neoplasm of pituitary gland: Secondary | ICD-10-CM | POA: Diagnosis not present

## 2016-12-21 DIAGNOSIS — E291 Testicular hypofunction: Secondary | ICD-10-CM | POA: Diagnosis not present

## 2016-12-22 ENCOUNTER — Other Ambulatory Visit: Payer: Self-pay | Admitting: Internal Medicine

## 2016-12-22 DIAGNOSIS — N529 Male erectile dysfunction, unspecified: Secondary | ICD-10-CM

## 2017-01-03 DIAGNOSIS — R1319 Other dysphagia: Secondary | ICD-10-CM | POA: Diagnosis not present

## 2017-01-03 DIAGNOSIS — R112 Nausea with vomiting, unspecified: Secondary | ICD-10-CM | POA: Diagnosis not present

## 2017-01-03 DIAGNOSIS — Z9884 Bariatric surgery status: Secondary | ICD-10-CM | POA: Diagnosis not present

## 2017-01-03 DIAGNOSIS — R1310 Dysphagia, unspecified: Secondary | ICD-10-CM | POA: Diagnosis not present

## 2017-01-24 HISTORY — PX: LAPAROSCOPIC REPAIR AND REMOVAL OF GASTRIC BAND: SHX5919

## 2017-01-30 DIAGNOSIS — R1310 Dysphagia, unspecified: Secondary | ICD-10-CM | POA: Diagnosis not present

## 2017-01-30 DIAGNOSIS — Z87891 Personal history of nicotine dependence: Secondary | ICD-10-CM | POA: Diagnosis not present

## 2017-01-30 DIAGNOSIS — K219 Gastro-esophageal reflux disease without esophagitis: Secondary | ICD-10-CM | POA: Diagnosis not present

## 2017-01-30 DIAGNOSIS — Z9884 Bariatric surgery status: Secondary | ICD-10-CM | POA: Diagnosis not present

## 2017-02-13 DIAGNOSIS — K9509 Other complications of gastric band procedure: Secondary | ICD-10-CM | POA: Diagnosis not present

## 2017-02-13 DIAGNOSIS — Z87891 Personal history of nicotine dependence: Secondary | ICD-10-CM | POA: Diagnosis not present

## 2017-02-13 DIAGNOSIS — Z7982 Long term (current) use of aspirin: Secondary | ICD-10-CM | POA: Diagnosis not present

## 2017-02-13 DIAGNOSIS — Z01818 Encounter for other preprocedural examination: Secondary | ICD-10-CM | POA: Diagnosis not present

## 2017-02-13 DIAGNOSIS — K660 Peritoneal adhesions (postprocedural) (postinfection): Secondary | ICD-10-CM | POA: Diagnosis not present

## 2017-02-13 DIAGNOSIS — Y831 Surgical operation with implant of artificial internal device as the cause of abnormal reaction of the patient, or of later complication, without mention of misadventure at the time of the procedure: Secondary | ICD-10-CM | POA: Diagnosis not present

## 2017-02-13 DIAGNOSIS — K219 Gastro-esophageal reflux disease without esophagitis: Secondary | ICD-10-CM | POA: Diagnosis not present

## 2017-02-13 DIAGNOSIS — Z9884 Bariatric surgery status: Secondary | ICD-10-CM | POA: Diagnosis not present

## 2017-02-13 DIAGNOSIS — R1310 Dysphagia, unspecified: Secondary | ICD-10-CM | POA: Diagnosis not present

## 2017-02-13 DIAGNOSIS — R1319 Other dysphagia: Secondary | ICD-10-CM | POA: Diagnosis not present

## 2017-02-13 DIAGNOSIS — I10 Essential (primary) hypertension: Secondary | ICD-10-CM | POA: Diagnosis not present

## 2017-02-13 DIAGNOSIS — G2581 Restless legs syndrome: Secondary | ICD-10-CM | POA: Diagnosis not present

## 2017-02-13 DIAGNOSIS — R1314 Dysphagia, pharyngoesophageal phase: Secondary | ICD-10-CM | POA: Diagnosis not present

## 2017-02-20 DIAGNOSIS — E785 Hyperlipidemia, unspecified: Secondary | ICD-10-CM | POA: Diagnosis not present

## 2017-02-20 DIAGNOSIS — I10 Essential (primary) hypertension: Secondary | ICD-10-CM | POA: Diagnosis not present

## 2017-02-20 DIAGNOSIS — Z48815 Encounter for surgical aftercare following surgery on the digestive system: Secondary | ICD-10-CM | POA: Diagnosis not present

## 2017-03-19 ENCOUNTER — Other Ambulatory Visit: Payer: Self-pay | Admitting: Internal Medicine

## 2017-03-19 DIAGNOSIS — G2581 Restless legs syndrome: Secondary | ICD-10-CM

## 2017-03-19 NOTE — Telephone Encounter (Signed)
Called patient LVM to schedule follow appt for February.

## 2017-05-10 ENCOUNTER — Other Ambulatory Visit: Payer: Self-pay | Admitting: Internal Medicine

## 2017-05-10 DIAGNOSIS — I10 Essential (primary) hypertension: Secondary | ICD-10-CM

## 2017-05-14 NOTE — Telephone Encounter (Signed)
Could not lvm to set up appt, mailbox is full.

## 2017-05-15 NOTE — Telephone Encounter (Signed)
Mailbox is full.

## 2017-06-28 ENCOUNTER — Other Ambulatory Visit: Payer: Self-pay | Admitting: Family Medicine

## 2017-07-16 ENCOUNTER — Encounter: Payer: Self-pay | Admitting: Internal Medicine

## 2017-07-16 ENCOUNTER — Ambulatory Visit (INDEPENDENT_AMBULATORY_CARE_PROVIDER_SITE_OTHER): Payer: BLUE CROSS/BLUE SHIELD | Admitting: Internal Medicine

## 2017-07-16 VITALS — BP 134/80 | HR 99 | Ht 71.0 in | Wt 243.0 lb

## 2017-07-16 DIAGNOSIS — I10 Essential (primary) hypertension: Secondary | ICD-10-CM | POA: Diagnosis not present

## 2017-07-16 DIAGNOSIS — E349 Endocrine disorder, unspecified: Secondary | ICD-10-CM | POA: Diagnosis not present

## 2017-07-16 DIAGNOSIS — F5101 Primary insomnia: Secondary | ICD-10-CM

## 2017-07-16 DIAGNOSIS — K219 Gastro-esophageal reflux disease without esophagitis: Secondary | ICD-10-CM | POA: Diagnosis not present

## 2017-07-16 DIAGNOSIS — Z0001 Encounter for general adult medical examination with abnormal findings: Secondary | ICD-10-CM | POA: Diagnosis not present

## 2017-07-16 DIAGNOSIS — E782 Mixed hyperlipidemia: Secondary | ICD-10-CM

## 2017-07-16 DIAGNOSIS — G4700 Insomnia, unspecified: Secondary | ICD-10-CM | POA: Diagnosis not present

## 2017-07-16 DIAGNOSIS — F324 Major depressive disorder, single episode, in partial remission: Secondary | ICD-10-CM

## 2017-07-16 DIAGNOSIS — G2581 Restless legs syndrome: Secondary | ICD-10-CM | POA: Diagnosis not present

## 2017-07-16 DIAGNOSIS — D352 Benign neoplasm of pituitary gland: Secondary | ICD-10-CM | POA: Diagnosis not present

## 2017-07-16 DIAGNOSIS — Z0000 Encounter for general adult medical examination without abnormal findings: Secondary | ICD-10-CM

## 2017-07-16 LAB — POCT URINALYSIS DIPSTICK
Bilirubin, UA: NEGATIVE
Blood, UA: NEGATIVE
Glucose, UA: NEGATIVE
Ketones, UA: NEGATIVE
Leukocytes, UA: NEGATIVE
Nitrite, UA: NEGATIVE
PROTEIN UA: NEGATIVE
Spec Grav, UA: 1.02 (ref 1.010–1.025)
UROBILINOGEN UA: 0.2 U/dL
pH, UA: 6 (ref 5.0–8.0)

## 2017-07-16 MED ORDER — ATORVASTATIN CALCIUM 40 MG PO TABS: 40.0000 mg | ORAL_TABLET | Freq: Every day | ORAL | 3 refills | Status: DC

## 2017-07-16 MED ORDER — PRAMIPEXOLE DIHYDROCHLORIDE 1 MG PO TABS: 1.0000 mg | ORAL_TABLET | Freq: Every day | ORAL | 1 refills | Status: DC

## 2017-07-16 MED ORDER — ESOMEPRAZOLE MAGNESIUM 40 MG PO CPDR: 40.0000 mg | DELAYED_RELEASE_CAPSULE | Freq: Every day | ORAL | 3 refills | Status: DC

## 2017-07-16 MED ORDER — ESCITALOPRAM OXALATE 10 MG PO TABS: 10.0000 mg | ORAL_TABLET | Freq: Every day | ORAL | 1 refills | Status: DC

## 2017-07-16 MED ORDER — BENAZEPRIL-HYDROCHLOROTHIAZIDE 20-25 MG PO TABS: 1.0000 | ORAL_TABLET | Freq: Every day | ORAL | 3 refills | Status: DC

## 2017-07-16 MED ORDER — SUVOREXANT 10 MG PO TABS: 10.0000 mg | ORAL_TABLET | Freq: Every day | ORAL | 5 refills | Status: DC

## 2017-07-16 NOTE — Progress Notes (Signed)
Date:  07/16/2017   Name:  Kyle Ortega [facility].   DOB:  11-Jan-1976   MRN:  [id number]   Chief Complaint: Annual Exam ([place]- 9) and Hypertension [facility]. is a 42 y.o. male who presents today for his Complete Annual Exam. He feels fairly well. He reports exercising none but plans to start. He reports he is sleeping poorly. He is going through a divorce and is stressed.  Hypertension  This is a chronic problem. The problem is controlled. Pertinent negatives include no chest pain, headaches, palpitations or shortness of breath.  Hyperlipidemia  This is a chronic problem. Associated symptoms include myalgias (restless leg). Pertinent negatives include no chest pain or shortness of breath. Current antihyperlipidemic treatment includes statins.  Gastroesophageal Reflux  He complains of heartburn and a hoarse voice. He reports no abdominal pain, no chest pain, no choking or no wheezing. This is a recurrent problem. The problem occurs frequently (since lap band removal). Pertinent negatives include no fatigue. He has tried a PPI for the symptoms.  Restless Leg - variable control on medications. He heard about taking low dose naltrexone for RLS.  It seemed to help when he took it for weight loss but higher doses (25 mg) made him dizzy.  Overweight - had lap band removed last year and was put on Bupropion and Naltrexone (= to Contrave) to help with weight loss.  Mood disorder - depressed and having trouble getting himself back to usual activities.  Divorce is still pending.  He is not exercising.  He is not sleeping well - ran out of [place].   Review of Systems  Constitutional: Negative for appetite change, chills, diaphoresis, fatigue and unexpected weight change.  HENT: Positive for hoarse voice. Negative for hearing loss, tinnitus, trouble swallowing and voice change.   Eyes: Negative for visual disturbance.  Respiratory: Negative for choking, shortness of breath and wheezing.    Cardiovascular: Negative for chest pain, palpitations and leg swelling.  Gastrointestinal: Positive for heartburn. Negative for abdominal pain, blood in stool, constipation and diarrhea.  Genitourinary: Negative for difficulty urinating, dysuria and frequency.  Musculoskeletal: Positive for myalgias (restless leg). Negative for arthralgias and back pain.  Skin: Negative for color change and rash.  Neurological: Negative for dizziness, syncope and headaches.  Hematological: Negative for adenopathy.  Psychiatric/Behavioral: Positive for sleep disturbance. Negative for dysphoric mood.    Patient Active Problem List   Diagnosis Date Noted  . Testicular hypofunction 06/29/2016  . Major depressive disorder with single episode, in partial remission ([place]) 04/04/2016  . Complete tear of MCL of knee, right, sequela 04/03/2016  . Snoring 11/03/2015  . Hypotestosteronism 03/31/2015  . Overweight (BMI 25.0-29.9) 03/31/2015  . Restless leg syndrome 03/31/2015  . Essential hypertension 03/31/2015  . Mixed hyperlipidemia 03/31/2015  . FH: heart disease 03/31/2015  . Family history of peripheral arterial disease 03/31/2015  . History of nephrolithiasis 03/31/2015  . Tachycardia 11/26/2014  . Pituitary microadenoma ([place]) 06/04/2014    Prior to Admission medications   Medication Sig Start Date End Date Taking? Authorizing Provider  aspirin 81 MG chewable tablet Chew 1 tablet by mouth daily. 10/21/09  Yes [provider]  atorvastatin (LIPITOR) 40 MG tablet Take 1 tablet (40 mg total) by mouth daily. 07/31/16  Yes Glean Hess, MD  benazepril-hydrochlorthiazide (LOTENSIN HCT) 20-25 MG tablet TAKE 1 TABLET BY MOUTH DAILY. 05/10/17  Yes Glean Hess, MD  pramipexole (MIRAPEX) 1 MG tablet TAKE 1-2 TABLETS (1-2 MG TOTAL)  BY MOUTH AT BEDTIME. 03/19/17  Yes Glean Hess, MD  sildenafil (VIAGRA) 100 MG tablet TAKE 1/2 TO 1 TABLET BY MOUTH DAILY AS NEEDED 12/23/16  Yes Glean Hess,  MD  TESTOSTERONE IM Inject 200 mg into the muscle once a week.   Yes [provider]  Suvorexant (BELSOMRA) 10 MG TABS Take 10 mg by mouth at bedtime. Patient not taking: Reported on 07/16/2017 04/13/16   Glean Hess, MD    Allergies  Allergen Reactions  . Requip [Ropinirole] Nausea Only  . Shellfish Allergy   . Codeine Nausea Only    Past Surgical History:  Procedure Laterality Date  . ANTERIOR CRUCIATE LIGAMENT REPAIR Left   . LAPAROSCOPIC GASTRIC BAND REMOVAL WITH LAPAROSCOPIC GASTRIC SLEEVE RESECTION  01/2017   removed.  Marland Kitchen LAPAROSCOPIC GASTRIC BANDING    . TENDON REPAIR Right    thumb    Social History   Tobacco Use  . Smoking status: Former Research scientist (life sciences)  . Smokeless tobacco: Never Used  Substance Use Topics  . Alcohol use: Yes    Alcohol/week: 0.0 oz  . Drug use: No     Medication list has been reviewed and updated.  PHQ 2/9 Scores 07/16/2017 11/03/2015  PHQ - 2 Score 2 0  PHQ- 9 Score 9 -    Physical Exam  Constitutional: He is oriented to person, place, and time. He appears well-developed and well-nourished.  HENT:  Head: Normocephalic.  Right Ear: Tympanic membrane, external ear and ear canal normal.  Left Ear: Tympanic membrane, external ear and ear canal normal.  Nose: Nose normal.  Mouth/Throat: Uvula is midline and oropharynx is clear and moist.  Eyes: Conjunctivae and EOM are normal. Pupils are equal, round, and reactive to light.  Neck: Normal range of motion. Neck supple. Carotid bruit is not present. No thyromegaly present.  Cardiovascular: Normal rate, regular rhythm, normal heart sounds and intact distal pulses.  Pulmonary/Chest: Effort normal and breath sounds normal. He has no wheezes. Right breast exhibits no mass. Left breast exhibits no mass.  Abdominal: Soft. Normal appearance and bowel sounds are normal. There is no hepatosplenomegaly. There is no tenderness.  Musculoskeletal: Normal range of motion.  Lymphadenopathy:    He has  no cervical adenopathy.  Neurological: He is alert and oriented to person, place, and time. He has normal reflexes.  Skin: Skin is warm, dry and intact.  Psychiatric: His speech is normal and behavior is normal. Judgment and thought content normal. Cognition and memory are normal. He exhibits a depressed mood.  Nursing note and vitals reviewed.   BP 134/80   Pulse 99   Ht 5\' 11"  (1.803 m)   Wt 243 lb (110.2 kg)   SpO2 96%   BMI 33.89 kg/m   Assessment and Plan: 1. Major depressive disorder with single episode, in partial remission (HCC) Begin medication - escitalopram (LEXAPRO) 10 MG tablet; Take 1 tablet (10 mg total) by mouth daily.  Dispense: 90 tablet; Refill: 1 - TSH  2. Annual physical exam Encourage exercise and healthy diet - POCT urinalysis dipstick  3. Essential hypertension controlled - benazepril-hydrochlorthiazide (LOTENSIN HCT) 20-25 MG tablet; Take 1 tablet by mouth daily.  Dispense: 90 tablet; Refill: 3 - CBC with Differential/Platelet - Comprehensive metabolic panel  4. Pituitary microadenoma ([place])  5. Mixed hyperlipidemia - atorvastatin (LIPITOR) 40 MG tablet; Take 1 tablet (40 mg total) by mouth daily.  Dispense: 90 tablet; Refill: 3 - Lipid panel  6. Restless leg syndrome Will check on  Naltrexone - pramipexole (MIRAPEX) 1 MG tablet; Take 1-2 tablets (1-2 mg total) by mouth at bedtime.  Dispense: 180 tablet; Refill: 1  7. GERD without esophagitis Controlled on PPI - esomeprazole (NEXIUM) 40 MG capsule; Take 1 capsule (40 mg total) by mouth daily.  Dispense: 90 capsule; Refill: 3  8. Hypotestosteronism On supplements but still has ED  Recommend Endocrinology follow up - PSA  9. Insomnia, unspecified type - Suvorexant (BELSOMRA) 10 MG TABS; Take 10 mg by mouth at bedtime.  Dispense: 30 tablet; Refill: 5   Meds ordered this encounter  Medications  . escitalopram (LEXAPRO) 10 MG tablet    Sig: Take 1 tablet (10 mg total) by mouth daily.     Dispense:  90 tablet    Refill:  1  . esomeprazole (NEXIUM) 40 MG capsule    Sig: Take 1 capsule (40 mg total) by mouth daily.    Dispense:  90 capsule    Refill:  3  . atorvastatin (LIPITOR) 40 MG tablet    Sig: Take 1 tablet (40 mg total) by mouth daily.    Dispense:  90 tablet    Refill:  3  . benazepril-hydrochlorthiazide (LOTENSIN HCT) 20-25 MG tablet    Sig: Take 1 tablet by mouth daily.    Dispense:  90 tablet    Refill:  3  . pramipexole (MIRAPEX) 1 MG tablet    Sig: Take 1-2 tablets (1-2 mg total) by mouth at bedtime.    Dispense:  180 tablet    Refill:  1  . Suvorexant (BELSOMRA) 10 MG TABS    Sig: Take 10 mg by mouth at bedtime.    Dispense:  30 tablet    Refill:  5    Partially dictated using Editor, commissioning. Any errors are unintentional.  Halina Maidens, MD [facility]  07/16/2017

## 2017-07-17 ENCOUNTER — Encounter: Payer: Self-pay | Admitting: Internal Medicine

## 2017-07-17 LAB — CBC WITH DIFFERENTIAL/PLATELET
BASOS: 0 %
Basophils Absolute: 0 10*3/uL (ref 0.0–0.2)
EOS (ABSOLUTE): 0.2 10*3/uL (ref 0.0–0.4)
Eos: 2 %
Hematocrit: 56.3 % — ABNORMAL HIGH (ref 37.5–51.0)
Hemoglobin: 19.5 g/dL — ABNORMAL HIGH (ref 13.0–17.7)
IMMATURE GRANS (ABS): 0.1 10*3/uL (ref 0.0–0.1)
Immature Granulocytes: 1 %
LYMPHS ABS: 2 10*3/uL (ref 0.7–3.1)
LYMPHS: 21 %
MCH: 27.5 pg (ref 26.6–33.0)
MCHC: 34.6 g/dL (ref 31.5–35.7)
MCV: 80 fL (ref 79–97)
Monocytes Absolute: 0.9 10*3/uL (ref 0.1–0.9)
Monocytes: 10 %
NEUTROS ABS: 6.2 10*3/uL (ref 1.4–7.0)
Neutrophils: 66 %
PLATELETS: 216 10*3/uL (ref 150–379)
RBC: 7.08 x10E6/uL (ref 4.14–5.80)
RDW: 15.4 % (ref 12.3–15.4)
WBC: 9.5 10*3/uL (ref 3.4–10.8)

## 2017-07-17 LAB — COMPREHENSIVE METABOLIC PANEL
A/G RATIO: 1.9 (ref 1.2–2.2)
ALBUMIN: 5.1 g/dL (ref 3.5–5.5)
ALK PHOS: 95 IU/L (ref 39–117)
ALT: 31 IU/L (ref 0–44)
AST: 24 IU/L (ref 0–40)
BILIRUBIN TOTAL: 0.5 mg/dL (ref 0.0–1.2)
BUN / CREAT RATIO: 14 (ref 9–20)
BUN: 14 mg/dL (ref 6–24)
CHLORIDE: 96 mmol/L (ref 96–106)
CO2: 27 mmol/L (ref 20–29)
Calcium: 9.8 mg/dL (ref 8.7–10.2)
Creatinine, Ser: 1 mg/dL (ref 0.76–1.27)
GFR calc Af Amer: 106 mL/min/{1.73_m2} (ref >59)
GFR calc non Af Amer: 92 mL/min/{1.73_m2} (ref >59)
Globulin, Total: 2.7 g/dL (ref 1.5–4.5)
Glucose: 77 mg/dL (ref 65–99)
POTASSIUM: 4.2 mmol/L (ref 3.5–5.2)
Sodium: 141 mmol/L (ref 134–144)
TOTAL PROTEIN: 7.8 g/dL (ref 6.0–8.5)

## 2017-07-17 LAB — LIPID PANEL
Chol/HDL Ratio: 7.1 ratio — ABNORMAL HIGH (ref 0.0–5.0)
Cholesterol, Total: 256 mg/dL — ABNORMAL HIGH (ref 100–199)
HDL: 36 mg/dL — ABNORMAL LOW (ref >39)
LDL Calculated: 172 mg/dL — ABNORMAL HIGH (ref 0–99)
Triglycerides: 241 mg/dL — ABNORMAL HIGH (ref 0–149)
VLDL Cholesterol Cal: 48 mg/dL — ABNORMAL HIGH (ref 5–40)

## 2017-07-17 LAB — PSA: PROSTATE SPECIFIC AG, SERUM: 1.8 ng/mL (ref 0.0–4.0)

## 2017-07-17 LAB — TSH: TSH: 1.2 u[IU]/mL (ref 0.450–4.500)

## 2017-08-31 ENCOUNTER — Encounter: Payer: Self-pay | Admitting: Internal Medicine

## 2017-08-31 ENCOUNTER — Ambulatory Visit: Payer: BLUE CROSS/BLUE SHIELD | Admitting: Internal Medicine

## 2017-08-31 VITALS — BP 128/80 | HR 96 | Ht 71.0 in | Wt 237.0 lb

## 2017-08-31 DIAGNOSIS — D751 Secondary polycythemia: Secondary | ICD-10-CM

## 2017-08-31 DIAGNOSIS — F39 Unspecified mood [affective] disorder: Secondary | ICD-10-CM | POA: Diagnosis not present

## 2017-08-31 DIAGNOSIS — I10 Essential (primary) hypertension: Secondary | ICD-10-CM | POA: Diagnosis not present

## 2017-08-31 DIAGNOSIS — G2581 Restless legs syndrome: Secondary | ICD-10-CM | POA: Diagnosis not present

## 2017-08-31 MED ORDER — NALTREXONE HCL 50 MG PO TABS: 25.0000 mg | ORAL_TABLET | Freq: Two times a day (BID) | ORAL | 5 refills | Status: DC

## 2017-08-31 NOTE — Progress Notes (Signed)
Date:  08/31/2017   Name:  Kyle Ortega [facility].   DOB:  05-31-1976   MRN:  [id number]   Chief Complaint: Depression (PHQ9- 0 . States he got his self together and does not need Lexapro medication. ) Hypertension  This is a chronic problem. The problem is controlled. Pertinent negatives include no chest pain, headaches, palpitations or shortness of breath.    RLS - he has discovered that naltrexone given by [facility] has helped his RLS.  He is almost out and would like to continue it.  He has been able to cut Mirapex back to .5 mg qhs.  Depression - his sx have resolved - he addressed the issue and is doing much better.  Lipids - he had stopped his medication at last visit but has now resumed it.    Polycythemia - last CBC was abnormal.  He feels well, no headache, dizziness, smoking.  Review of Systems  Constitutional: Negative for chills, fatigue and fever.  Eyes: Negative for visual disturbance.  Respiratory: Negative for cough, chest tightness and shortness of breath.   Cardiovascular: Negative for chest pain and palpitations.  Gastrointestinal: Negative for abdominal pain, constipation and diarrhea.  Musculoskeletal: Positive for myalgias (RLS).  Skin: Negative for color change and rash.  Neurological: Negative for dizziness, numbness and headaches.  Psychiatric/Behavioral: Positive for sleep disturbance. Negative for dysphoric mood. The patient is not nervous/anxious.     Patient Active Problem List   Diagnosis Date Noted  . Insomnia 07/16/2017  . GERD without esophagitis 07/16/2017  . Testicular hypofunction 06/29/2016  . Major depressive disorder with single episode, in partial remission ([place]) 04/04/2016  . Complete tear of MCL of knee, right, sequela 04/03/2016  . Snoring 11/03/2015  . Hypotestosteronism 03/31/2015  . Overweight (BMI 25.0-29.9) 03/31/2015  . Restless leg syndrome 03/31/2015  . Essential hypertension 03/31/2015  . Mixed hyperlipidemia  03/31/2015  . FH: heart disease 03/31/2015  . Family history of peripheral arterial disease 03/31/2015  . History of nephrolithiasis 03/31/2015  . Tachycardia 11/26/2014  . Pituitary microadenoma ([place]) 06/04/2014    Prior to Admission medications   Medication Sig Start Date End Date Taking? Authorizing Provider  aspirin 81 MG chewable tablet Chew 1 tablet by mouth daily. 10/21/09  Yes [provider]  atorvastatin (LIPITOR) 40 MG tablet Take 1 tablet (40 mg total) by mouth daily. 07/16/17  Yes Glean Hess, MD  benazepril-hydrochlorthiazide (LOTENSIN HCT) 20-25 MG tablet Take 1 tablet by mouth daily. 07/16/17  Yes Glean Hess, MD  esomeprazole (NEXIUM) 40 MG capsule Take 1 capsule (40 mg total) by mouth daily. 07/16/17  Yes Glean Hess, MD  naltrexone (DEPADE) 50 MG tablet Take 25 mg by mouth 2 (two) times daily. Restless leg   Yes [provider]  pramipexole (MIRAPEX) 1 MG tablet Take 1-2 tablets (1-2 mg total) by mouth at bedtime. 07/16/17  Yes Glean Hess, MD  sildenafil (VIAGRA) 100 MG tablet TAKE 1/2 TO 1 TABLET BY MOUTH DAILY AS NEEDED 12/23/16  Yes Glean Hess, MD  Suvorexant (BELSOMRA) 10 MG TABS Take 10 mg by mouth at bedtime. 07/16/17  Yes Glean Hess, MD  escitalopram (LEXAPRO) 10 MG tablet Take 1 tablet (10 mg total) by mouth daily. Patient not taking: Reported on 08/31/2017 07/16/17   Glean Hess, MD  TESTOSTERONE IM Inject 200 mg into the muscle once a week.    [provider]    Allergies  Allergen Reactions  .  Requip [Ropinirole] Nausea Only  . Shellfish Allergy   . Codeine Nausea Only    Past Surgical History:  Procedure Laterality Date  . ANTERIOR CRUCIATE LIGAMENT REPAIR Left   . LAPAROSCOPIC GASTRIC BAND REMOVAL WITH LAPAROSCOPIC GASTRIC SLEEVE RESECTION  01/2017   removed.  Marland Kitchen LAPAROSCOPIC GASTRIC BANDING    . TENDON REPAIR Right    thumb    Social History   Tobacco Use  . Smoking status: Former  Research scientist (life sciences)  . Smokeless tobacco: Never Used  Substance Use Topics  . Alcohol use: Yes    Alcohol/week: 0.0 oz  . Drug use: No     Medication list has been reviewed and updated.  PHQ 2/9 Scores 08/31/2017 07/16/2017 11/03/2015  PHQ - 2 Score 0 2 0  PHQ- 9 Score 0 9 -    Physical Exam  Constitutional: He is oriented to person, place, and time. He appears well-developed. No distress.  HENT:  Head: Normocephalic and atraumatic.  Neck: Normal range of motion. Neck supple. No thyromegaly present.  Cardiovascular: Normal rate, regular rhythm and normal heart sounds.  Pulmonary/Chest: Effort normal. No respiratory distress. He has no wheezes. He has no rales.  Musculoskeletal: Normal range of motion. He exhibits no edema.  Neurological: He is alert and oriented to person, place, and time.  Skin: Skin is warm and dry. No rash noted.  Psychiatric: He has a normal mood and affect. His behavior is normal. Thought content normal.  Nursing note and vitals reviewed.   BP 128/80   Pulse 96   Ht 5\' 11"  (1.803 m)   Wt 237 lb (107.5 kg)   SpO2 98%   BMI 33.05 kg/m   Assessment and Plan: 1. Essential hypertension controlled  2. Restless leg syndrome Will continue low dose Try to stop Mirapex - naltrexone (DEPADE) 50 MG tablet; Take 0.5 tablets (25 mg total) by mouth 2 (two) times daily. Restless leg  Dispense: 30 tablet; Refill: 5  3. Polycythemia Recheck labs and advise - CBC with Differential/Platelet  4. Mood disorder in full remission ([place]) Continue to monitor for worsening sx off of medication  Meds ordered this encounter  Medications  . naltrexone (DEPADE) 50 MG tablet    Sig: Take 0.5 tablets (25 mg total) by mouth 2 (two) times daily. Restless leg    Dispense:  30 tablet    Refill:  5    Partially dictated using Editor, commissioning. Any errors are unintentional.  Halina Maidens, MD [facility]  08/31/2017

## 2017-09-01 LAB — CBC WITH DIFFERENTIAL/PLATELET
BASOS ABS: 0 10*3/uL (ref 0.0–0.2)
BASOS: 0 %
EOS (ABSOLUTE): 0.2 10*3/uL (ref 0.0–0.4)
EOS: 2 %
HEMATOCRIT: 55 % — AB (ref 37.5–51.0)
HEMOGLOBIN: 18.7 g/dL — AB (ref 13.0–17.7)
IMMATURE GRANS (ABS): 0.1 10*3/uL (ref 0.0–0.1)
Immature Granulocytes: 1 %
LYMPHS ABS: 2.8 10*3/uL (ref 0.7–3.1)
LYMPHS: 29 %
MCH: 28 pg (ref 26.6–33.0)
MCHC: 34 g/dL (ref 31.5–35.7)
MCV: 82 fL (ref 79–97)
MONOCYTES: 7 %
Monocytes Absolute: 0.7 10*3/uL (ref 0.1–0.9)
NEUTROS ABS: 5.9 10*3/uL (ref 1.4–7.0)
Neutrophils: 61 %
Platelets: 276 10*3/uL (ref 150–379)
RBC: 6.68 x10E6/uL — ABNORMAL HIGH (ref 4.14–5.80)
RDW: 15.1 % (ref 12.3–15.4)
WBC: 9.7 10*3/uL (ref 3.4–10.8)

## 2017-10-06 ENCOUNTER — Other Ambulatory Visit: Payer: Self-pay | Admitting: Internal Medicine

## 2017-10-06 DIAGNOSIS — F324 Major depressive disorder, single episode, in partial remission: Secondary | ICD-10-CM

## 2018-01-01 ENCOUNTER — Other Ambulatory Visit: Payer: Self-pay | Admitting: Internal Medicine

## 2018-01-01 DIAGNOSIS — N529 Male erectile dysfunction, unspecified: Secondary | ICD-10-CM

## 2018-01-13 ENCOUNTER — Other Ambulatory Visit: Payer: Self-pay | Admitting: Internal Medicine

## 2018-01-13 DIAGNOSIS — G4700 Insomnia, unspecified: Secondary | ICD-10-CM

## 2018-02-12 DIAGNOSIS — R232 Flushing: Secondary | ICD-10-CM | POA: Diagnosis not present

## 2018-02-12 DIAGNOSIS — D352 Benign neoplasm of pituitary gland: Secondary | ICD-10-CM | POA: Diagnosis not present

## 2018-02-12 DIAGNOSIS — I151 Hypertension secondary to other renal disorders: Secondary | ICD-10-CM | POA: Diagnosis not present

## 2018-02-12 DIAGNOSIS — Z87892 Personal history of anaphylaxis: Secondary | ICD-10-CM | POA: Diagnosis not present

## 2018-02-12 DIAGNOSIS — E668 Other obesity: Secondary | ICD-10-CM | POA: Diagnosis not present

## 2018-02-19 DIAGNOSIS — D352 Benign neoplasm of pituitary gland: Secondary | ICD-10-CM | POA: Diagnosis not present

## 2018-02-19 DIAGNOSIS — Z87892 Personal history of anaphylaxis: Secondary | ICD-10-CM | POA: Diagnosis not present

## 2018-02-19 DIAGNOSIS — R232 Flushing: Secondary | ICD-10-CM | POA: Diagnosis not present

## 2018-02-19 DIAGNOSIS — D72823 Leukemoid reaction: Secondary | ICD-10-CM | POA: Diagnosis not present

## 2018-02-19 DIAGNOSIS — D849 Immunodeficiency, unspecified: Secondary | ICD-10-CM | POA: Diagnosis not present

## 2018-02-19 DIAGNOSIS — D72828 Other elevated white blood cell count: Secondary | ICD-10-CM | POA: Diagnosis not present

## 2018-02-21 ENCOUNTER — Other Ambulatory Visit: Payer: Self-pay | Admitting: Internal Medicine

## 2018-02-21 DIAGNOSIS — G2581 Restless legs syndrome: Secondary | ICD-10-CM

## 2018-04-10 ENCOUNTER — Other Ambulatory Visit: Payer: Self-pay | Admitting: Internal Medicine

## 2018-04-10 ENCOUNTER — Encounter: Payer: Self-pay | Admitting: Internal Medicine

## 2018-04-10 DIAGNOSIS — G2581 Restless legs syndrome: Secondary | ICD-10-CM

## 2018-04-10 NOTE — Telephone Encounter (Signed)
Patient my chart message. Requesting to send in higher dose. Please Advise.

## 2018-04-22 ENCOUNTER — Ambulatory Visit: Payer: BLUE CROSS/BLUE SHIELD | Admitting: Internal Medicine

## 2018-04-22 ENCOUNTER — Encounter: Payer: Self-pay | Admitting: Internal Medicine

## 2018-04-22 VITALS — BP 121/78 | HR 94 | Resp 16 | Ht 71.0 in | Wt 242.0 lb

## 2018-04-22 DIAGNOSIS — K219 Gastro-esophageal reflux disease without esophagitis: Secondary | ICD-10-CM

## 2018-04-22 DIAGNOSIS — E782 Mixed hyperlipidemia: Secondary | ICD-10-CM

## 2018-04-22 DIAGNOSIS — D751 Secondary polycythemia: Secondary | ICD-10-CM

## 2018-04-22 DIAGNOSIS — Z23 Encounter for immunization: Secondary | ICD-10-CM

## 2018-04-22 DIAGNOSIS — Z91018 Allergy to other foods: Secondary | ICD-10-CM | POA: Insufficient documentation

## 2018-04-22 DIAGNOSIS — F5101 Primary insomnia: Secondary | ICD-10-CM | POA: Diagnosis not present

## 2018-04-22 DIAGNOSIS — I10 Essential (primary) hypertension: Secondary | ICD-10-CM | POA: Diagnosis not present

## 2018-04-22 DIAGNOSIS — G2581 Restless legs syndrome: Secondary | ICD-10-CM

## 2018-04-22 MED ORDER — PRAMIPEXOLE DIHYDROCHLORIDE 1 MG PO TABS: 2.0000 mg | ORAL_TABLET | Freq: Every day | ORAL | 0 refills | Status: DC

## 2018-04-22 NOTE — Progress Notes (Signed)
Date:  04/22/2018   Name:  Kyle Ortega [facility].   DOB:  1975-08-31   MRN:  [id number]   Chief Complaint: Hypertension and restless leg (needs refills )  Hypertension  This is a chronic problem. The problem is controlled. Pertinent negatives include no headaches or shortness of breath. Past treatments include ACE inhibitors and diuretics.  Hyperlipidemia  This is a chronic problem. Pertinent negatives include no shortness of breath. Current antihyperlipidemic treatment includes statins.  Gastroesophageal Reflux  He reports no abdominal pain, no coughing or no wheezing. The problem occurs rarely. Pertinent negatives include no fatigue. He has tried a PPI for the symptoms. The treatment provided significant relief.  Insomnia  Primary symptoms: sleep disturbance.  The problem occurs every several days. Past treatments include medication (Belsomra or over the counter sleep aids).  RLS - on  Mirapex with good control of sx.  He has found that alcohol and gluten irritate his sx. Alpha-gal allergy - discovered earlier this year.    Lab Results  Component Value Date   CHOL 256 (H) 07/16/2017   HDL 36 (L) 07/16/2017   LDLCALC 172 (H) 07/16/2017   TRIG 241 (H) 07/16/2017   CHOLHDL 7.1 (H) 07/16/2017   Lab Results  Component Value Date   CREATININE 1.00 07/16/2017   BUN 14 07/16/2017   NA 141 07/16/2017   K 4.2 07/16/2017   CL 96 07/16/2017   CO2 27 07/16/2017     Review of Systems  Constitutional: Negative for chills, fatigue and fever.  Respiratory: Negative for cough, chest tightness, shortness of breath and wheezing.   Gastrointestinal: Negative for abdominal pain, constipation and diarrhea.  Neurological: Negative for dizziness and headaches.  Psychiatric/Behavioral: Positive for sleep disturbance. Negative for dysphoric mood. The patient has insomnia.     Patient Active Problem List   Diagnosis Date Noted  . Polycythemia 08/31/2017  . Insomnia 07/16/2017  . GERD  without esophagitis 07/16/2017  . Testicular hypofunction 06/29/2016  . Complete tear of MCL of knee, right, sequela 04/03/2016  . Snoring 11/03/2015  . Hypotestosteronism 03/31/2015  . Overweight (BMI 25.0-29.9) 03/31/2015  . Restless leg syndrome 03/31/2015  . Essential hypertension 03/31/2015  . Mixed hyperlipidemia 03/31/2015  . FH: heart disease 03/31/2015  . Family history of peripheral arterial disease 03/31/2015  . History of nephrolithiasis 03/31/2015  . Tachycardia 11/26/2014    Allergies  Allergen Reactions  . Alpha-Gal Anaphylaxis    Can not eat red meat   . Requip [Ropinirole] Nausea Only  . Shellfish Allergy   . Codeine Nausea Only    Past Surgical History:  Procedure Laterality Date  . ANTERIOR CRUCIATE LIGAMENT REPAIR Left   . LAPAROSCOPIC GASTRIC BAND REMOVAL WITH LAPAROSCOPIC GASTRIC SLEEVE RESECTION  01/2017   removed.  Marland Kitchen LAPAROSCOPIC GASTRIC BANDING    . TENDON REPAIR Right    thumb    Social History   Tobacco Use  . Smoking status: Former Research scientist (life sciences)  . Smokeless tobacco: Never Used  Substance Use Topics  . Alcohol use: Not Currently    Alcohol/week: 0.0 standard drinks  . Drug use: No     Medication list has been reviewed and updated.  Current Meds  Medication Sig  . aspirin 81 MG chewable tablet Chew 1 tablet by mouth daily.  Marland Kitchen atorvastatin (LIPITOR) 40 MG tablet Take 1 tablet (40 mg total) by mouth daily.  . BELSOMRA 10 MG TABS TAKE 1 TABLET BY MOUTH AT BEDTIME  . benazepril-hydrochlorthiazide (LOTENSIN HCT) 20-25  MG tablet Take 1 tablet by mouth daily.  Marland Kitchen esomeprazole (NEXIUM) 40 MG capsule Take 1 capsule (40 mg total) by mouth daily.  . pramipexole (MIRAPEX) 1 MG tablet TAKE 1-2 TABLETS (1-2 MG TOTAL) BY MOUTH AT BEDTIME.  . sildenafil (VIAGRA) 100 MG tablet TAKE 1/2 TO 1 TABLET BY MOUTH DAILY AS NEEDED  . [DISCONTINUED] naltrexone (DEPADE) 50 MG tablet Take 0.5 tablets (25 mg total) by mouth 2 (two) times daily. Restless leg  .  [DISCONTINUED] TESTOSTERONE IM Inject 200 mg into the muscle once a week.    PHQ 2/9 Scores 04/22/2018 08/31/2017 07/16/2017 11/03/2015  PHQ - 2 Score 0 0 2 0  PHQ- 9 Score 0 0 9 -    Physical Exam  Constitutional: He is oriented to person, place, and time. He appears well-developed. No distress.  HENT:  Head: Normocephalic and atraumatic.  Neck: Normal range of motion. Neck supple.  Cardiovascular: Normal rate, regular rhythm and normal heart sounds.  Pulmonary/Chest: Effort normal and breath sounds normal. No respiratory distress.  Musculoskeletal: Normal range of motion.  Neurological: He is alert and oriented to person, place, and time.  Skin: Skin is warm and dry. No rash noted.  Psychiatric: He has a normal mood and affect. His behavior is normal. Thought content normal.  Nursing note and vitals reviewed.   BP 121/78   Pulse 94   Resp 16   Ht 5\' 11"  (1.803 m)   Wt 242 lb (109.8 kg) Comment: patient reports 235 at home  SpO2 97%   BMI 33.75 kg/m   Assessment and Plan: 1. Essential hypertension Controlled   2. Primary insomnia Holding Belsomra for now Continue over the counter aids  3. Mixed hyperlipidemia On statin therapy  4. GERD without esophagitis Controlled with PPI  5. Polycythemia No sx - will check CBC next visit  6. 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 Restless leg syndrome Increase Rx to 2-3 mg at HS - pramipexole (MIRAPEX) 1 MG tablet; Take 2-3 tablets (2-3 mg total) by mouth at bedtime.  Dispense: 270 tablet; Refill: 0   Partially dictated using Editor, commissioning. Any errors are unintentional.  Halina Maidens, MD [facility]  04/22/2018

## 2018-05-12 DIAGNOSIS — S0181XA Laceration without foreign body of other part of head, initial encounter: Secondary | ICD-10-CM | POA: Diagnosis not present

## 2018-05-21 ENCOUNTER — Other Ambulatory Visit: Payer: Self-pay | Admitting: Internal Medicine

## 2018-05-21 DIAGNOSIS — G2581 Restless legs syndrome: Secondary | ICD-10-CM

## 2018-06-28 ENCOUNTER — Other Ambulatory Visit: Payer: Self-pay | Admitting: Internal Medicine

## 2018-06-28 DIAGNOSIS — I10 Essential (primary) hypertension: Secondary | ICD-10-CM

## 2018-07-30 ENCOUNTER — Other Ambulatory Visit: Payer: Self-pay | Admitting: Internal Medicine

## 2018-07-30 DIAGNOSIS — G2581 Restless legs syndrome: Secondary | ICD-10-CM

## 2018-08-29 ENCOUNTER — Other Ambulatory Visit: Payer: Self-pay | Admitting: Internal Medicine

## 2018-08-29 DIAGNOSIS — K219 Gastro-esophageal reflux disease without esophagitis: Secondary | ICD-10-CM

## 2018-10-09 ENCOUNTER — Ambulatory Visit (INDEPENDENT_AMBULATORY_CARE_PROVIDER_SITE_OTHER): Payer: BLUE CROSS/BLUE SHIELD | Admitting: Internal Medicine

## 2018-10-09 ENCOUNTER — Other Ambulatory Visit: Payer: Self-pay

## 2018-10-09 ENCOUNTER — Encounter: Payer: Self-pay | Admitting: Internal Medicine

## 2018-10-09 VITALS — BP 120/86 | HR 87 | Ht 71.0 in | Wt 248.0 lb

## 2018-10-09 DIAGNOSIS — G4700 Insomnia, unspecified: Secondary | ICD-10-CM

## 2018-10-09 DIAGNOSIS — K219 Gastro-esophageal reflux disease without esophagitis: Secondary | ICD-10-CM | POA: Diagnosis not present

## 2018-10-09 DIAGNOSIS — I10 Essential (primary) hypertension: Secondary | ICD-10-CM

## 2018-10-09 DIAGNOSIS — E782 Mixed hyperlipidemia: Secondary | ICD-10-CM

## 2018-10-09 DIAGNOSIS — Z0000 Encounter for general adult medical examination without abnormal findings: Secondary | ICD-10-CM

## 2018-10-09 DIAGNOSIS — G2581 Restless legs syndrome: Secondary | ICD-10-CM

## 2018-10-09 LAB — POCT URINALYSIS DIPSTICK
Bilirubin, UA: NEGATIVE
Blood, UA: NEGATIVE
Glucose, UA: NEGATIVE
Ketones, UA: NEGATIVE
Leukocytes, UA: NEGATIVE
Nitrite, UA: NEGATIVE
Protein, UA: NEGATIVE
Spec Grav, UA: 1.015 (ref 1.010–1.025)
Urobilinogen, UA: 0.2 E.U./dL
pH, UA: 6.5 (ref 5.0–8.0)

## 2018-10-09 MED ORDER — ATORVASTATIN CALCIUM 40 MG PO TABS: 40.0000 mg | ORAL_TABLET | Freq: Every day | ORAL | 3 refills | Status: DC

## 2018-10-09 MED ORDER — SUVOREXANT 10 MG PO TABS: 1.0000 | ORAL_TABLET | Freq: Every day | ORAL | 2 refills | Status: DC

## 2018-10-09 NOTE — Patient Instructions (Signed)
This information is directly available on the CDC website: https://www.cdc.gov/coronavirus/2019-ncov/if-you-are-sick/steps-when-sick.html    Source:CDC Reference to specific commercial products, manufacturers, companies, or trademarks does not constitute its endorsement or recommendation by the U.S. Government, Department of Health and Human Services, or Centers for Disease Control and Prevention.  

## 2018-10-09 NOTE — Progress Notes (Signed)
Date:  10/09/2018   Name:  Kyle Ortega.   DOB:  [birth year]   MRN:  [id number]   Chief Complaint: Annual Exam Kyle Ortega. is a 43 y.o. male who presents today for his Complete Annual Exam. He feels well. He reports exercising regularly. He reports he is sleeping poorly due to RLS and general unease.   Hypertension  This is a chronic problem. The problem is controlled. Pertinent negatives include no chest pain, headaches, palpitations or shortness of breath. Past treatments include ACE inhibitors and diuretics. The current treatment provides significant improvement.  Hyperlipidemia  This is a chronic problem. The problem is controlled. Pertinent negatives include no chest pain, myalgias or shortness of breath. Current antihyperlipidemic treatment includes statins. The current treatment provides significant improvement of lipids.  Gastroesophageal Reflux  He complains of heartburn. He reports no abdominal pain, no chest pain, no choking or no wheezing. This is a chronic problem. Pertinent negatives include no fatigue. He has tried a PPI for the symptoms.  Insomnia  Primary symptoms: sleep disturbance.  The problem occurs every several days. The problem is unchanged. Treatments tried: not currently taking any medication.  RLS - taking 2 tablets every evening, can not tolerate 3 due to nausea.  He has failed lyrica, gabapentin.  Seen by Neurology but never tried Sinemet.  Lab Results  Component Value Date   CREATININE 1.00 07/16/2017   BUN 14 07/16/2017   NA 141 07/16/2017   K 4.2 07/16/2017   CL 96 07/16/2017   CO2 27 07/16/2017   Lab Results  Component Value Date   CHOL 256 (H) 07/16/2017   HDL 36 (L) 07/16/2017   LDLCALC 172 (H) 07/16/2017   TRIG 241 (H) 07/16/2017   CHOLHDL 7.1 (H) 07/16/2017    Review of Systems  Constitutional: Negative for appetite change, chills, diaphoresis, fatigue and unexpected weight change.  HENT: Negative for hearing loss,  tinnitus, trouble swallowing and voice change.   Eyes: Negative for visual disturbance.  Respiratory: Negative for choking, shortness of breath and wheezing.   Cardiovascular: Negative for chest pain, palpitations and leg swelling.  Gastrointestinal: Positive for heartburn. Negative for abdominal pain, blood in stool, constipation and diarrhea.  Genitourinary: Negative for difficulty urinating, dysuria and frequency.  Musculoskeletal: Negative for arthralgias, back pain and myalgias.  Skin: Negative for color change and rash.  Allergic/Immunologic: Positive for food allergies.  Neurological: Negative for dizziness, syncope and headaches.  Hematological: Negative for adenopathy.  Psychiatric/Behavioral: Positive for sleep disturbance. Negative for dysphoric mood. The patient has insomnia.     Patient Active Problem List   Diagnosis Date Noted  . Allergy to galactose-alpha-1,3-galactose 04/22/2018  . Polycythemia 08/31/2017  . Insomnia 07/16/2017  . GERD without esophagitis 07/16/2017  . Testicular hypofunction 06/29/2016  . Complete tear of MCL of knee, right, sequela 04/03/2016  . Overweight (BMI 25.0-29.9) 03/31/2015  . Restless leg syndrome 03/31/2015  . Essential hypertension 03/31/2015  . Mixed hyperlipidemia 03/31/2015  . History of nephrolithiasis 03/31/2015    Allergies  Allergen Reactions  . Alpha-Gal Anaphylaxis    Can not eat red meat   . Requip [Ropinirole] Nausea Only  . Shellfish Allergy   . Codeine Nausea Only    Past Surgical History:  Procedure Laterality Date  . ANTERIOR CRUCIATE LIGAMENT REPAIR Left   . LAPAROSCOPIC GASTRIC BANDING    . LAPAROSCOPIC REPAIR AND REMOVAL OF GASTRIC BAND  01/2017   removed.  . TENDON REPAIR Right  thumb    Social History   Tobacco Use  . Smoking status: Former Smoker    Packs/day: 0.50    Years: 5.00    Pack years: 2.50    Types: Cigarettes    Last attempt to quit: 2015    Years since quitting: 5.2  .  Smokeless tobacco: Never Used  Substance Use Topics  . Alcohol use: Not Currently    Alcohol/week: 0.0 standard drinks  . Drug use: No     Medication list has been reviewed and updated.  Current Meds  Medication Sig  . aspirin 81 MG chewable tablet Chew 1 tablet by mouth daily.  Marland Kitchen atorvastatin (LIPITOR) 40 MG tablet Take 1 tablet (40 mg total) by mouth daily.  . benazepril-hydrochlorthiazide (LOTENSIN HCT) 20-25 MG tablet TAKE 1 TABLET BY MOUTH EVERY DAY  . esomeprazole (NEXIUM) 40 MG capsule TAKE 1 CAPSULE BY MOUTH EVERY DAY  . pramipexole (MIRAPEX) 1 MG tablet TAKE 2-3 TABLETS (2-3 MG TOTAL) BY MOUTH AT BEDTIME.  . sildenafil (VIAGRA) 100 MG tablet TAKE 1/2 TO 1 TABLET BY MOUTH DAILY AS NEEDED    PHQ 2/9 Scores 10/09/2018 04/22/2018 08/31/2017 07/16/2017  PHQ - 2 Score 0 0 0 2  PHQ- 9 Score - 0 0 9    BP Readings from Last 3 Encounters:  10/09/18 120/86  04/22/18 121/78  08/31/17 128/80    Physical Exam Vitals signs and nursing note reviewed.  Constitutional:      Appearance: Normal appearance. He is well-developed.  HENT:     Head: Normocephalic.     Right Ear: Tympanic membrane, ear canal and external ear normal.     Left Ear: Tympanic membrane, ear canal and external ear normal.     Nose: Nose normal.     Mouth/Throat:     Pharynx: Uvula midline.  Eyes:     Conjunctiva/sclera: Conjunctivae normal.     Pupils: Pupils are equal, round, and reactive to light.  Neck:     Musculoskeletal: Normal range of motion and neck supple.     Thyroid: No thyromegaly.     Vascular: No carotid bruit.  Cardiovascular:     Rate and Rhythm: Normal rate and regular rhythm.     Heart sounds: Normal heart sounds.  Pulmonary:     Effort: Pulmonary effort is normal.     Breath sounds: Normal breath sounds. No wheezing.  Chest:     Breasts:        Right: No mass.        Left: No mass.  Abdominal:     General: Bowel sounds are normal.     Palpations: Abdomen is soft.      Tenderness: There is no abdominal tenderness.  Musculoskeletal: Normal range of motion.  Lymphadenopathy:     Cervical: No cervical adenopathy.  Skin:    General: Skin is warm and dry.  Neurological:     Mental Status: He is alert and oriented to person, place, and time.     Deep Tendon Reflexes: Reflexes are normal and symmetric.  Psychiatric:        Speech: Speech normal.        Behavior: Behavior normal.        Thought Content: Thought content normal.        Judgment: Judgment normal.     Wt Readings from Last 3 Encounters:  10/09/18 248 lb (112.5 kg)  04/22/18 242 lb (109.8 kg)  08/31/17 237 lb (107.5 kg)    BP  120/86   Pulse 87   Ht 5\' 11"  (1.803 m)   Wt 248 lb (112.5 kg)   SpO2 96%   BMI 34.59 kg/m   Assessment and Plan: 1. Annual physical exam Normal exam except for weight - discussed diet and exercise - HIV Antibody (routine testing w rflx) - POCT urinalysis dipstick  2. Essential hypertension controlled - CBC with Differential/Platelet - Comprehensive metabolic panel - TSH  3. GERD without esophagitis Controlled on PPI  4. Mixed hyperlipidemia On statin therapy - atorvastatin (LIPITOR) 40 MG tablet; Take 1 tablet (40 mg total) by mouth daily.  Dispense: 90 tablet; Refill: 3 - Lipid panel  5. Restless leg syndrome Continue Requip Consider Neurology again if sx worsening  6. Insomnia, unspecified type - Suvorexant (BELSOMRA) 10 MG TABS; Take 1 tablet by mouth at bedtime.  Dispense: 30 tablet; Refill: 2   Partially dictated using Editor, commissioning. Any errors are unintentional.  Halina Maidens, MD [facility]  10/09/2018

## 2018-10-10 LAB — TSH: TSH: 0.855 u[IU]/mL (ref 0.450–4.500)

## 2018-10-10 LAB — CBC WITH DIFFERENTIAL/PLATELET
Basophils Absolute: 0.1 10*3/uL (ref 0.0–0.2)
Basos: 1 %
EOS (ABSOLUTE): 0.2 10*3/uL (ref 0.0–0.4)
Eos: 2 %
Hematocrit: 49.5 % (ref 37.5–51.0)
Hemoglobin: 16.4 g/dL (ref 13.0–17.7)
Immature Grans (Abs): 0.1 10*3/uL (ref 0.0–0.1)
Immature Granulocytes: 1 %
Lymphocytes Absolute: 3.2 10*3/uL — ABNORMAL HIGH (ref 0.7–3.1)
Lymphs: 39 %
MCH: 27.6 pg (ref 26.6–33.0)
MCHC: 33.1 g/dL (ref 31.5–35.7)
MCV: 83 fL (ref 79–97)
Monocytes Absolute: 0.5 10*3/uL (ref 0.1–0.9)
Monocytes: 6 %
Neutrophils Absolute: 4.2 10*3/uL (ref 1.4–7.0)
Neutrophils: 51 %
Platelets: 246 10*3/uL (ref 150–450)
RBC: 5.95 x10E6/uL — ABNORMAL HIGH (ref 4.14–5.80)
RDW: 13.5 % (ref 11.6–15.4)
WBC: 8.2 10*3/uL (ref 3.4–10.8)

## 2018-10-10 LAB — COMPREHENSIVE METABOLIC PANEL
ALT: 39 IU/L (ref 0–44)
AST: 24 IU/L (ref 0–40)
Albumin/Globulin Ratio: 2.4 — ABNORMAL HIGH (ref 1.2–2.2)
Albumin: 4.7 g/dL (ref 4.0–5.0)
Alkaline Phosphatase: 86 IU/L (ref 39–117)
BUN/Creatinine Ratio: 17 (ref 9–20)
BUN: 13 mg/dL (ref 6–24)
Bilirubin Total: 0.3 mg/dL (ref 0.0–1.2)
CO2: 22 mmol/L (ref 20–29)
Calcium: 9.3 mg/dL (ref 8.7–10.2)
Chloride: 103 mmol/L (ref 96–106)
Creatinine, Ser: 0.76 mg/dL (ref 0.76–1.27)
GFR calc Af Amer: 128 mL/min/{1.73_m2} (ref >59)
GFR calc non Af Amer: 111 mL/min/{1.73_m2} (ref >59)
Globulin, Total: 2 g/dL (ref 1.5–4.5)
Glucose: 102 mg/dL — ABNORMAL HIGH (ref 65–99)
Potassium: 4.3 mmol/L (ref 3.5–5.2)
Sodium: 139 mmol/L (ref 134–144)
Total Protein: 6.7 g/dL (ref 6.0–8.5)

## 2018-10-10 LAB — LIPID PANEL
Chol/HDL Ratio: 5.3 ratio — ABNORMAL HIGH (ref 0.0–5.0)
Cholesterol, Total: 235 mg/dL — ABNORMAL HIGH (ref 100–199)
HDL: 44 mg/dL (ref >39)
LDL Calculated: 150 mg/dL — ABNORMAL HIGH (ref 0–99)
Triglycerides: 203 mg/dL — ABNORMAL HIGH (ref 0–149)
VLDL Cholesterol Cal: 41 mg/dL — ABNORMAL HIGH (ref 5–40)

## 2018-10-10 LAB — HIV ANTIBODY (ROUTINE TESTING W REFLEX): HIV Screen 4th Generation wRfx: NONREACTIVE

## 2018-10-16 DIAGNOSIS — M9905 Segmental and somatic dysfunction of pelvic region: Secondary | ICD-10-CM | POA: Diagnosis not present

## 2018-10-16 DIAGNOSIS — M9903 Segmental and somatic dysfunction of lumbar region: Secondary | ICD-10-CM | POA: Diagnosis not present

## 2018-10-16 DIAGNOSIS — M5137 Other intervertebral disc degeneration, lumbosacral region: Secondary | ICD-10-CM | POA: Diagnosis not present

## 2018-10-16 DIAGNOSIS — M955 Acquired deformity of pelvis: Secondary | ICD-10-CM | POA: Diagnosis not present

## 2018-10-17 DIAGNOSIS — M9905 Segmental and somatic dysfunction of pelvic region: Secondary | ICD-10-CM | POA: Diagnosis not present

## 2018-10-17 DIAGNOSIS — M5137 Other intervertebral disc degeneration, lumbosacral region: Secondary | ICD-10-CM | POA: Diagnosis not present

## 2018-10-17 DIAGNOSIS — M9903 Segmental and somatic dysfunction of lumbar region: Secondary | ICD-10-CM | POA: Diagnosis not present

## 2018-10-17 DIAGNOSIS — M955 Acquired deformity of pelvis: Secondary | ICD-10-CM | POA: Diagnosis not present

## 2018-10-23 DIAGNOSIS — M9905 Segmental and somatic dysfunction of pelvic region: Secondary | ICD-10-CM | POA: Diagnosis not present

## 2018-10-23 DIAGNOSIS — M955 Acquired deformity of pelvis: Secondary | ICD-10-CM | POA: Diagnosis not present

## 2018-10-23 DIAGNOSIS — M9903 Segmental and somatic dysfunction of lumbar region: Secondary | ICD-10-CM | POA: Diagnosis not present

## 2018-10-23 DIAGNOSIS — M5137 Other intervertebral disc degeneration, lumbosacral region: Secondary | ICD-10-CM | POA: Diagnosis not present

## 2018-10-25 DIAGNOSIS — M9905 Segmental and somatic dysfunction of pelvic region: Secondary | ICD-10-CM | POA: Diagnosis not present

## 2018-10-25 DIAGNOSIS — M5137 Other intervertebral disc degeneration, lumbosacral region: Secondary | ICD-10-CM | POA: Diagnosis not present

## 2018-10-25 DIAGNOSIS — M9903 Segmental and somatic dysfunction of lumbar region: Secondary | ICD-10-CM | POA: Diagnosis not present

## 2018-10-25 DIAGNOSIS — M955 Acquired deformity of pelvis: Secondary | ICD-10-CM | POA: Diagnosis not present

## 2018-10-31 ENCOUNTER — Other Ambulatory Visit: Payer: Self-pay | Admitting: Internal Medicine

## 2018-10-31 DIAGNOSIS — G2581 Restless legs syndrome: Secondary | ICD-10-CM

## 2019-01-03 DIAGNOSIS — Z20828 Contact with and (suspected) exposure to other viral communicable diseases: Secondary | ICD-10-CM | POA: Diagnosis not present

## 2019-01-09 ENCOUNTER — Other Ambulatory Visit: Payer: Self-pay | Admitting: Internal Medicine

## 2019-01-09 DIAGNOSIS — I10 Essential (primary) hypertension: Secondary | ICD-10-CM

## 2019-02-11 ENCOUNTER — Other Ambulatory Visit: Payer: Self-pay | Admitting: Internal Medicine

## 2019-02-11 DIAGNOSIS — N529 Male erectile dysfunction, unspecified: Secondary | ICD-10-CM

## 2019-10-13 ENCOUNTER — Encounter: Payer: BLUE CROSS/BLUE SHIELD | Admitting: Internal Medicine

## 2019-10-23 ENCOUNTER — Other Ambulatory Visit: Payer: Self-pay | Admitting: Internal Medicine

## 2019-10-23 DIAGNOSIS — K219 Gastro-esophageal reflux disease without esophagitis: Secondary | ICD-10-CM

## 2019-11-05 ENCOUNTER — Other Ambulatory Visit: Payer: Self-pay | Admitting: Internal Medicine

## 2019-11-05 DIAGNOSIS — K219 Gastro-esophageal reflux disease without esophagitis: Secondary | ICD-10-CM

## 2019-11-28 ENCOUNTER — Other Ambulatory Visit: Payer: Self-pay | Admitting: Internal Medicine

## 2019-11-28 DIAGNOSIS — N529 Male erectile dysfunction, unspecified: Secondary | ICD-10-CM

## 2019-11-28 NOTE — Telephone Encounter (Signed)
Patient called to schedule annual exam . appt made for 01/22/20. Refill provided until annual exam.

## 2019-12-14 ENCOUNTER — Other Ambulatory Visit: Payer: Self-pay | Admitting: Internal Medicine

## 2019-12-14 DIAGNOSIS — G2581 Restless legs syndrome: Secondary | ICD-10-CM

## 2019-12-14 NOTE — Telephone Encounter (Signed)
Filled with enough med to last until appt. Requested Prescriptions  Pending Prescriptions Disp Refills  . pramipexole (MIRAPEX) 1 MG tablet [Pharmacy Med Name: PRAMIPEXOLE 1 MG TABLET] 39 tablet 0    Sig: TAKE 2-3 TABLETS (2-3 MG TOTAL) BY MOUTH AT BEDTIME.     Neurology:  Parkinsonian Agents Failed - 12/14/2019 11:36 AM      Failed - Valid encounter within last 12 months    Recent Outpatient Visits          1 year ago Annual physical exam   [facility] Glean Hess, MD   1 year ago Essential hypertension   [facility] Glean Hess, MD   2 years ago Essential hypertension   [facility] Glean Hess, MD   2 years ago Annual physical exam   [facility] Glean Hess, MD   3 years ago Essential hypertension   [place], [place]      Future Appointments            In 1 month Glean Hess, MD [facility] [place] BP in normal range    BP Readings from Last 1 Encounters:  10/09/18 120/86

## 2020-01-04 ENCOUNTER — Encounter: Payer: Self-pay | Admitting: Internal Medicine

## 2020-01-04 ENCOUNTER — Other Ambulatory Visit: Payer: Self-pay | Admitting: Internal Medicine

## 2020-01-04 DIAGNOSIS — G2581 Restless legs syndrome: Secondary | ICD-10-CM

## 2020-01-04 MED ORDER — PRAMIPEXOLE DIHYDROCHLORIDE 1 MG PO TABS: 2.0000 mg | ORAL_TABLET | Freq: Every day | ORAL | 0 refills | Status: DC

## 2020-01-15 ENCOUNTER — Encounter: Payer: Self-pay | Admitting: Internal Medicine

## 2020-01-21 DIAGNOSIS — F5101 Primary insomnia: Secondary | ICD-10-CM | POA: Insufficient documentation

## 2020-01-21 NOTE — Progress Notes (Signed)
Date:  01/22/2020   Name:  Kyle Ortega.   DOB:  23-Oct-1975   MRN:  [id number]   Chief Complaint: Annual Exam (not taking belsomra), Dysuria (not urinating as much after drinking a gallon of water a day, wondering if he may be sweating it out some), and Mass (X3-4 months,right shoulder, not painful gettin gbigger)  Kyle Ortega. is a 44 y.o. male who presents today for his Complete Annual Exam. He feels well. He reports exercising exercise bike 3 times a week, walking 2-3 times a week. He reports he is sleeping fairly well.   Colonoscopy: none  Immunization History  Administered Date(s) Administered  . Influenza,inj,Quad PF,6+ Mos 03/31/2015, 04/04/2016, 04/22/2018  . PFIZER SARS-COV-2 Vaccination 08/18/2019, 09/04/2019  . Tdap 03/31/2015, 09/26/2015   Mass - has soft tissue mass over right shoulder blade for the past few months that is getting bigger.  It causes some pressure when sleeping but no pain.    Hypertension Pertinent negatives include no chest pain, headaches, palpitations or shortness of breath.  Hyperlipidemia Associated symptoms include myalgias (restless legs ). Pertinent negatives include no chest pain or shortness of breath.  Gastroesophageal Reflux He reports no abdominal pain, no chest pain, no choking or no wheezing. Pertinent negatives include no fatigue.  Insomnia Primary symptoms: sleep disturbance.   Restless leg syndrome - taking Mirapex regularly and does well.  If he runs out, his sx are severe. He is wondering about alternative treatment.  Lab Results  Component Value Date   CREATININE 0.76 10/09/2018   BUN 13 10/09/2018   NA 139 10/09/2018   K 4.3 10/09/2018   CL 103 10/09/2018   CO2 22 10/09/2018   Lab Results  Component Value Date   CHOL 235 (H) 10/09/2018   HDL 44 10/09/2018   LDLCALC 150 (H) 10/09/2018   TRIG 203 (H) 10/09/2018   CHOLHDL 5.3 (H) 10/09/2018   Lab Results  Component Value Date   TSH 0.855 10/09/2018    No results found for: HGBA1C Lab Results  Component Value Date   WBC 8.2 10/09/2018   HGB 16.4 10/09/2018   HCT 49.5 10/09/2018   MCV 83 10/09/2018   PLT 246 10/09/2018   Lab Results  Component Value Date   ALT 39 10/09/2018   AST 24 10/09/2018   ALKPHOS 86 10/09/2018   BILITOT 0.3 10/09/2018     Review of Systems  Constitutional: Negative for appetite change, chills, diaphoresis, fatigue and unexpected weight change.  HENT: Negative for hearing loss, tinnitus, trouble swallowing and voice change.   Eyes: Negative for visual disturbance.  Respiratory: Negative for choking, shortness of breath and wheezing.   Cardiovascular: Negative for chest pain, palpitations and leg swelling.  Gastrointestinal: Negative for abdominal pain, blood in stool, constipation and diarrhea.  Genitourinary: Negative for difficulty urinating, dysuria and frequency.  Musculoskeletal: Positive for myalgias (restless legs ). Negative for arthralgias and back pain.  Skin: Negative for color change and rash.       Large soft tissue mass on right shoulder  Neurological: Negative for dizziness, syncope and headaches.  Hematological: Negative for adenopathy.  Psychiatric/Behavioral: Positive for sleep disturbance. Negative for dysphoric mood. The patient has insomnia. The patient is not nervous/anxious.     Patient Active Problem List   Diagnosis Date Noted  . Primary insomnia 01/21/2020  . Allergy to galactose-alpha-1,3-galactose 04/22/2018  . Polycythemia 08/31/2017  . GERD without esophagitis 07/16/2017  . Testicular hypofunction 06/29/2016  . Complete  tear of MCL of knee, right, sequela 04/03/2016  . Overweight (BMI 25.0-29.9) 03/31/2015  . Restless leg syndrome 03/31/2015  . Essential hypertension 03/31/2015  . Mixed hyperlipidemia 03/31/2015  . History of nephrolithiasis 03/31/2015    Allergies  Allergen Reactions  . Alpha-Gal Anaphylaxis    Can not eat red meat   . Requip [Ropinirole]  Nausea Only  . Shellfish Allergy   . Codeine Nausea Only    Past Surgical History:  Procedure Laterality Date  . ANTERIOR CRUCIATE LIGAMENT REPAIR Left   . LAPAROSCOPIC GASTRIC BANDING    . LAPAROSCOPIC REPAIR AND REMOVAL OF GASTRIC BAND  01/2017   removed.  . TENDON REPAIR Right    thumb    Social History   Tobacco Use  . Smoking status: Former Smoker    Packs/day: 0.50    Years: 5.00    Pack years: 2.50    Types: Cigarettes    Quit date: 2015    Years since quitting: 6.5  . Smokeless tobacco: Never Used  Vaping Use  . Vaping Use: Never used  Substance Use Topics  . Alcohol use: Not Currently    Alcohol/week: 0.0 standard drinks  . Drug use: No     Medication list has been reviewed and updated.  Current Meds  Medication Sig  . atorvastatin (LIPITOR) 40 MG tablet Take 1 tablet (40 mg total) by mouth daily.  . benazepril-hydrochlorthiazide (LOTENSIN HCT) 20-25 MG tablet Take 1 tablet by mouth daily.  Marland Kitchen esomeprazole (NEXIUM) 40 MG capsule Take 1 capsule (40 mg total) by mouth daily.  . pramipexole (MIRAPEX) 1 MG tablet Take 2-3 tablets (2-3 mg total) by mouth at bedtime.  . sildenafil (VIAGRA) 100 MG tablet Take 1 tablet (100 mg total) by mouth daily as needed for erectile dysfunction.  . [DISCONTINUED] atorvastatin (LIPITOR) 40 MG tablet Take 1 tablet (40 mg total) by mouth daily.  . [DISCONTINUED] benazepril-hydrochlorthiazide (LOTENSIN HCT) 20-25 MG tablet TAKE 1 TABLET BY MOUTH EVERY DAY  . [DISCONTINUED] esomeprazole (NEXIUM) 40 MG capsule TAKE 1 CAPSULE BY MOUTH EVERY DAY  . [DISCONTINUED] pramipexole (MIRAPEX) 1 MG tablet Take 2-3 tablets (2-3 mg total) by mouth at bedtime.  . [DISCONTINUED] sildenafil (VIAGRA) 100 MG tablet TAKE 1/2 TO 1 TABLET BY MOUTH DAILY AS NEEDED    PHQ 2/9 Scores 01/22/2020 10/09/2018 04/22/2018 08/31/2017  PHQ - 2 Score 0 0 0 0  PHQ- 9 Score 0 - 0 0    GAD 7 : Generalized Anxiety Score 01/22/2020  Nervous, Anxious, on Edge 0    Control/stop worrying 0  Worry too much - different things 0  Trouble relaxing 1  Restless 0  Easily annoyed or irritable 0  Afraid - awful might happen 0  Total GAD 7 Score 1  Anxiety Difficulty Not difficult at all    BP Readings from Last 3 Encounters:  01/22/20 124/70  10/09/18 120/86  04/22/18 121/78    Physical Exam Vitals and nursing note reviewed.  Constitutional:      Appearance: Normal appearance. He is well-developed.  HENT:     Head: Normocephalic.     Right Ear: Tympanic membrane, ear canal and external ear normal.     Left Ear: Tympanic membrane, ear canal and external ear normal.     Nose: Nose normal.     Mouth/Throat:     Pharynx: Uvula midline.  Eyes:     Conjunctiva/sclera: Conjunctivae normal.     Pupils: Pupils are equal, round, and reactive to light.  Neck:     Thyroid: No thyromegaly.     Vascular: No carotid bruit.  Cardiovascular:     Rate and Rhythm: Normal rate and regular rhythm.     Heart sounds: Normal heart sounds.  Pulmonary:     Effort: Pulmonary effort is normal.     Breath sounds: Normal breath sounds. No wheezing.  Chest:     Breasts:        Right: No mass.        Left: No mass.  Abdominal:     General: Bowel sounds are normal.     Palpations: Abdomen is soft.     Tenderness: There is no abdominal tenderness.  Musculoskeletal:        General: Normal range of motion.     Cervical back: Normal range of motion and neck supple.  Lymphadenopathy:     Cervical: No cervical adenopathy.  Skin:    General: Skin is warm and dry.     Capillary Refill: Capillary refill takes less than 2 seconds.       Neurological:     Mental Status: He is alert and oriented to person, place, and time.     Deep Tendon Reflexes: Reflexes are normal and symmetric.  Psychiatric:        Speech: Speech normal.        Behavior: Behavior normal.        Thought Content: Thought content normal.        Judgment: Judgment normal.     Wt Readings from  Last 3 Encounters:  01/22/20 (!) 244 lb (110.7 kg)  10/09/18 248 lb (112.5 kg)  04/22/18 242 lb (109.8 kg)    BP 124/70   Pulse 87   Temp 98.2 F (36.8 C) (Oral)   Ht 5\' 11"  (1.803 m)   Wt (!) 244 lb (110.7 kg)   SpO2 98%   BMI 34.03 kg/m   Assessment and Plan: 1. Annual physical exam Exam is normal except for weight. Encourage regular exercise and appropriate dietary changes. - PSA - POCT urinalysis dipstick  2. Essential hypertension Clinically stable exam with well controlled BP. Tolerating medications without side effects at this time. Pt to continue current regimen and low sodium diet; benefits of regular exercise as able discussed. - CBC with Differential/Platelet - Comprehensive metabolic panel - benazepril-hydrochlorthiazide (LOTENSIN HCT) 20-25 MG tablet; Take 1 tablet by mouth daily.  Dispense: 90 tablet; Refill: 3  3. Mixed hyperlipidemia Tolerating statin medication without side effects at this time Continue same therapy without change at this time. - Lipid panel - atorvastatin (LIPITOR) 40 MG tablet; Take 1 tablet (40 mg total) by mouth daily.  Dispense: 90 tablet; Refill: 3  4. Restless leg syndrome Having some vague side effects to mirapex but sx are so severe that he will not likely tolerate the titration phase of Requip If needed could refer to Neurology for other medications - pramipexole (MIRAPEX) 1 MG tablet; Take 2-3 tablets (2-3 mg total) by mouth at bedtime.  Dispense: 270 tablet; Refill: 3  5. Primary insomnia Improved on its own - no longer taking medications to aid in sleep  6. GERD without esophagitis Symptoms well controlled on daily PPI No red flag signs such as weight loss, n/v, melena Will continue nexium. - CBC with Differential/Platelet - esomeprazole (NEXIUM) 40 MG capsule; Take 1 capsule (40 mg total) by mouth daily.  Dispense: 90 capsule; Refill: 3  7. Need for hepatitis C screening test - Hepatitis C antibody  8. Lipoma of  torso - Ambulatory referral to General Surgery  9. Erectile dysfunction, unspecified erectile dysfunction type - sildenafil (VIAGRA) 100 MG tablet; Take 1 tablet (100 mg total) by mouth daily as needed for erectile dysfunction.  Dispense: 20 tablet; Refill: 3   Partially dictated using Editor, commissioning. Any errors are unintentional.  Halina Maidens, MD [facility]  01/22/2020

## 2020-01-22 ENCOUNTER — Other Ambulatory Visit: Payer: Self-pay

## 2020-01-22 ENCOUNTER — Encounter: Payer: Self-pay | Admitting: Internal Medicine

## 2020-01-22 ENCOUNTER — Ambulatory Visit (INDEPENDENT_AMBULATORY_CARE_PROVIDER_SITE_OTHER): Payer: PRIVATE HEALTH INSURANCE | Admitting: Internal Medicine

## 2020-01-22 VITALS — BP 124/70 | HR 87 | Temp 98.2°F | Ht 71.0 in | Wt 244.0 lb

## 2020-01-22 DIAGNOSIS — Z0000 Encounter for general adult medical examination without abnormal findings: Secondary | ICD-10-CM

## 2020-01-22 DIAGNOSIS — F5101 Primary insomnia: Secondary | ICD-10-CM

## 2020-01-22 DIAGNOSIS — G2581 Restless legs syndrome: Secondary | ICD-10-CM | POA: Diagnosis not present

## 2020-01-22 DIAGNOSIS — D171 Benign lipomatous neoplasm of skin and subcutaneous tissue of trunk: Secondary | ICD-10-CM

## 2020-01-22 DIAGNOSIS — E782 Mixed hyperlipidemia: Secondary | ICD-10-CM | POA: Diagnosis not present

## 2020-01-22 DIAGNOSIS — N529 Male erectile dysfunction, unspecified: Secondary | ICD-10-CM | POA: Diagnosis not present

## 2020-01-22 DIAGNOSIS — Z1159 Encounter for screening for other viral diseases: Secondary | ICD-10-CM | POA: Diagnosis not present

## 2020-01-22 DIAGNOSIS — I10 Essential (primary) hypertension: Secondary | ICD-10-CM

## 2020-01-22 DIAGNOSIS — K219 Gastro-esophageal reflux disease without esophagitis: Secondary | ICD-10-CM

## 2020-01-22 LAB — POCT URINALYSIS DIPSTICK
Bilirubin, UA: NEGATIVE
Blood, UA: NEGATIVE
Glucose, UA: NEGATIVE
Ketones, UA: NEGATIVE
Leukocytes, UA: NEGATIVE
Nitrite, UA: NEGATIVE
Protein, UA: NEGATIVE
Spec Grav, UA: 1.015 (ref 1.010–1.025)
Urobilinogen, UA: 0.2 E.U./dL
pH, UA: 6 (ref 5.0–8.0)

## 2020-01-22 MED ORDER — ESOMEPRAZOLE MAGNESIUM 40 MG PO CPDR: 40.0000 mg | DELAYED_RELEASE_CAPSULE | Freq: Every day | ORAL | 3 refills | Status: DC

## 2020-01-22 MED ORDER — SILDENAFIL CITRATE 100 MG PO TABS: 100.0000 mg | ORAL_TABLET | Freq: Every day | ORAL | 3 refills | Status: DC | PRN

## 2020-01-22 MED ORDER — BENAZEPRIL-HYDROCHLOROTHIAZIDE 20-25 MG PO TABS: 1.0000 | ORAL_TABLET | Freq: Every day | ORAL | 3 refills | Status: DC

## 2020-01-22 MED ORDER — ATORVASTATIN CALCIUM 40 MG PO TABS: 40.0000 mg | ORAL_TABLET | Freq: Every day | ORAL | 3 refills | Status: DC

## 2020-01-22 MED ORDER — PRAMIPEXOLE DIHYDROCHLORIDE 1 MG PO TABS: 2.0000 mg | ORAL_TABLET | Freq: Every day | ORAL | 3 refills | Status: DC

## 2020-01-23 LAB — CBC WITH DIFFERENTIAL/PLATELET
Basophils Absolute: 0 10*3/uL (ref 0.0–0.2)
Basos: 0 %
EOS (ABSOLUTE): 0.1 10*3/uL (ref 0.0–0.4)
Eos: 2 %
Hematocrit: 51.1 % — ABNORMAL HIGH (ref 37.5–51.0)
Hemoglobin: 16.9 g/dL (ref 13.0–17.7)
Immature Grans (Abs): 0 10*3/uL (ref 0.0–0.1)
Immature Granulocytes: 0 %
Lymphocytes Absolute: 3.6 10*3/uL — ABNORMAL HIGH (ref 0.7–3.1)
Lymphs: 41 %
MCH: 27.5 pg (ref 26.6–33.0)
MCHC: 33.1 g/dL (ref 31.5–35.7)
MCV: 83 fL (ref 79–97)
Monocytes Absolute: 0.7 10*3/uL (ref 0.1–0.9)
Monocytes: 7 %
Neutrophils Absolute: 4.5 10*3/uL (ref 1.4–7.0)
Neutrophils: 50 %
Platelets: 259 10*3/uL (ref 150–450)
RBC: 6.14 x10E6/uL — ABNORMAL HIGH (ref 4.14–5.80)
RDW: 14 % (ref 11.6–15.4)
WBC: 9 10*3/uL (ref 3.4–10.8)

## 2020-01-23 LAB — COMPREHENSIVE METABOLIC PANEL
ALT: 29 IU/L (ref 0–44)
AST: 20 IU/L (ref 0–40)
Albumin/Globulin Ratio: 2.2 (ref 1.2–2.2)
Albumin: 4.9 g/dL (ref 4.0–5.0)
Alkaline Phosphatase: 102 IU/L (ref 48–121)
BUN/Creatinine Ratio: 17 (ref 9–20)
BUN: 14 mg/dL (ref 6–24)
Bilirubin Total: 0.6 mg/dL (ref 0.0–1.2)
CO2: 24 mmol/L (ref 20–29)
Calcium: 10.4 mg/dL — ABNORMAL HIGH (ref 8.7–10.2)
Chloride: 98 mmol/L (ref 96–106)
Creatinine, Ser: 0.83 mg/dL (ref 0.76–1.27)
GFR calc Af Amer: 122 mL/min/{1.73_m2} (ref >59)
GFR calc non Af Amer: 106 mL/min/{1.73_m2} (ref >59)
Globulin, Total: 2.2 g/dL (ref 1.5–4.5)
Glucose: 91 mg/dL (ref 65–99)
Potassium: 4.2 mmol/L (ref 3.5–5.2)
Sodium: 139 mmol/L (ref 134–144)
Total Protein: 7.1 g/dL (ref 6.0–8.5)

## 2020-01-23 LAB — PSA: Prostate Specific Ag, Serum: 1.5 ng/mL (ref 0.0–4.0)

## 2020-01-23 LAB — LIPID PANEL
Chol/HDL Ratio: 6.5 ratio — ABNORMAL HIGH (ref 0.0–5.0)
Cholesterol, Total: 221 mg/dL — ABNORMAL HIGH (ref 100–199)
HDL: 34 mg/dL — ABNORMAL LOW (ref >39)
LDL Chol Calc (NIH): 139 mg/dL — ABNORMAL HIGH (ref 0–99)
Triglycerides: 266 mg/dL — ABNORMAL HIGH (ref 0–149)
VLDL Cholesterol Cal: 48 mg/dL — ABNORMAL HIGH (ref 5–40)

## 2020-01-23 LAB — HEPATITIS C ANTIBODY: Hep C Virus Ab: <0.1 s/co ratio (ref 0.0–0.9)

## 2020-02-02 ENCOUNTER — Encounter: Payer: Self-pay | Admitting: Surgery

## 2020-02-02 ENCOUNTER — Ambulatory Visit (INDEPENDENT_AMBULATORY_CARE_PROVIDER_SITE_OTHER): Payer: PRIVATE HEALTH INSURANCE | Admitting: Surgery

## 2020-02-02 ENCOUNTER — Other Ambulatory Visit: Payer: Self-pay

## 2020-02-02 VITALS — BP 126/80 | HR 104 | Temp 98.8°F | Ht 70.0 in | Wt 244.0 lb

## 2020-02-02 DIAGNOSIS — M7989 Other specified soft tissue disorders: Secondary | ICD-10-CM | POA: Diagnosis not present

## 2020-02-02 NOTE — Patient Instructions (Addendum)
Patient has been scheduled for a CT of the shoulder with contrast at [facility] on Friday August 13th at 11:00 am. you will need to arrive there by 10:45 am.  Prep:liquids only 4 hours prior. Patient verbalizes understanding.  Benadryl 50 mg 1 hour prior to scan.   You will follow up here the following Monday to discuss the results and next steps.

## 2020-02-03 ENCOUNTER — Encounter: Payer: Self-pay | Admitting: Surgery

## 2020-02-03 NOTE — Progress Notes (Signed)
02/03/2020  Reason for Visit:  Right shoulder mass  Referring Provider:  Halina Maidens, MD  History of Present Illness: Dashawn Bartnick. is a 44 y.o. male presenting for evaluation of a right shoulder mass.  The patient reports that he first noticed it about 4-5 months ago, and has been growing in size.  Denies any pain in the area, and denies any motor/sensory deficits from it.  Only reports feeling some pressure depending on how he lies down.  Denies any overlying skin redness or drainage issues.  Past Medical History: Past Medical History:  Diagnosis Date  . Depression   . Hyperlipidemia   . Hypotestosteronism   . Major depressive disorder with single episode, in partial remission ([place]) 04/04/2016  . Pituitary microadenoma ([place]) 06/04/2014   Repeat MRI 11/2016 showed no lesion - felt to be volume averaging error on previous scan  . Pituitary tumor    Dx 05/2014  . Restless leg syndrome   . Tachycardia 11/26/2014     Past Surgical History: Past Surgical History:  Procedure Laterality Date  . ANTERIOR CRUCIATE LIGAMENT REPAIR Left   . LAPAROSCOPIC GASTRIC BANDING    . LAPAROSCOPIC REPAIR AND REMOVAL OF GASTRIC BAND  01/2017   removed.  . TENDON REPAIR Right    thumb    Home Medications: Prior to Admission medications   Medication Sig Start Date End Date Taking? Authorizing Provider  atorvastatin (LIPITOR) 40 MG tablet Take 1 tablet (40 mg total) by mouth daily. 01/22/20  Yes Glean Hess, MD  benazepril-hydrochlorthiazide (LOTENSIN HCT) 20-25 MG tablet Take 1 tablet by mouth daily. 01/22/20  Yes Glean Hess, MD  esomeprazole (NEXIUM) 40 MG capsule Take 1 capsule (40 mg total) by mouth daily. 01/22/20  Yes Glean Hess, MD  pramipexole (MIRAPEX) 1 MG tablet Take 2-3 tablets (2-3 mg total) by mouth at bedtime. 01/22/20  Yes Glean Hess, MD  sildenafil (VIAGRA) 100 MG tablet Take 1 tablet (100 mg total) by mouth daily as needed for erectile dysfunction.  01/22/20  Yes Glean Hess, MD    Allergies: Allergies  Allergen Reactions  . Alpha-Gal Anaphylaxis    Can not eat red meat   . Requip [Ropinirole] Nausea Only  . Shellfish Allergy   . Codeine Nausea Only    Social History:  reports that he has been smoking cigarettes. He has a 2.50 pack-year smoking history. He has never used smokeless tobacco. He reports current alcohol use. He reports that he does not use drugs.   Family History: Family History  Problem Relation Age of Onset  . Diabetes Father        committed suicide  . Heart disease Father   . Stroke Father   . Hypertension Father   . Asthma Mother   . Rheum arthritis Mother     Review of Systems: Review of Systems  Constitutional: Negative for chills and fever.  HENT: Negative for hearing loss.   Eyes: Negative for blurred vision.  Respiratory: Negative for shortness of breath.   Cardiovascular: Negative for chest pain.  Gastrointestinal: Negative for abdominal pain, nausea and vomiting.  Genitourinary: Negative for dysuria.  Musculoskeletal:       Right shoulder mass  Skin: Negative for rash.  Neurological: Negative for dizziness, sensory change and weakness.  Psychiatric/Behavioral: Negative for depression.    Physical Exam BP 126/80   Pulse (!) 104   Temp 98.8 F (37.1 C)   Ht 5\' 10"  (1.778 m)  Wt 244 lb (110.7 kg)   SpO2 96%   BMI 35.01 kg/m  CONSTITUTIONAL: No acute distress HEENT:  Normocephalic, atraumatic, extraocular motion intact. NECK: Trachea is midline, and there is no jugular venous distension.  RESPIRATORY:  Normal respiratory effort without pathologic use of accessory muscles. CARDIOVASCULAR: Heart is regular rhythm and rate GI: The abdomen is soft, non-distended.  MUSCULOSKELETAL:  Normal muscle strength and range of motion in the right shoulder.  No peripheral edema or cyanosis. SKIN: Patient has a large, approximately 10 cm mass in the right shoulder, extending from the  superior portion of the scapula going anteriorly toward the scalene muscles.  It is mobile, soft, non-tender to palpation.  No overlying skin erythema or induration.  NEUROLOGIC:  Motor and sensation is grossly normal.  Cranial nerves are grossly intact. PSYCH:  Alert and oriented to person, place and time. Affect is normal.  Laboratory Analysis: No results found for this or any previous visit (from the past 24 hour(s)).  Imaging: No results found.  Assessment and Plan: This is a 44 y.o. male with a large right shoulder mass.  --Discussed with the patient that although this is likely a benign mass, typically it would not grow so much in size in a relatively short amount of time.  It is mobile, soft, which favors benign mass, and most likely a lipoma.  Given the size, I think it would be best to excise it in the OR.  As a precaution I would like to obtain a CT scan of the right shoulder to further evaluate the mass, particularly if it's encroaching on any essential structures. --Patient will follow up with me next week after CT scan is done so we can review the images and discuss surgical plan.  Face-to-face time spent with the patient and care providers was 40 minutes, with more than 50% of the time spent counseling, educating, and coordinating care of the patient.     Melvyn Neth, [facility]

## 2020-02-06 ENCOUNTER — Other Ambulatory Visit: Payer: Self-pay

## 2020-02-06 ENCOUNTER — Ambulatory Visit
Admission: RE | Admit: 2020-02-06 | Discharge: 2020-02-06 | Disposition: A | Discharge status: Home / Self Care | Payer: PRIVATE HEALTH INSURANCE | Source: Ambulatory Visit | Attending: Surgery | Admitting: Surgery

## 2020-02-06 DIAGNOSIS — M7989 Other specified soft tissue disorders: Secondary | ICD-10-CM | POA: Insufficient documentation

## 2020-02-06 MED ORDER — IOHEXOL 300 MG/ML  SOLN
75.0000 mL | Freq: Once | INTRAMUSCULAR | Status: AC | PRN
  Administered 2020-02-06: 75 mL via INTRAVENOUS

## 2020-02-09 ENCOUNTER — Telehealth: Payer: Self-pay | Admitting: Emergency Medicine

## 2020-02-09 ENCOUNTER — Encounter: Payer: Self-pay | Admitting: Surgery

## 2020-02-09 ENCOUNTER — Ambulatory Visit (INDEPENDENT_AMBULATORY_CARE_PROVIDER_SITE_OTHER): Payer: PRIVATE HEALTH INSURANCE | Admitting: Surgery

## 2020-02-09 ENCOUNTER — Other Ambulatory Visit: Payer: Self-pay

## 2020-02-09 VITALS — BP 142/90 | HR 84 | Temp 98.0°F | Resp 12 | Ht 70.0 in | Wt 244.0 lb

## 2020-02-09 DIAGNOSIS — M7989 Other specified soft tissue disorders: Secondary | ICD-10-CM

## 2020-02-09 NOTE — Progress Notes (Signed)
02/09/2020  History of Present Illness: Kyle Ortega. is a 44 y.o. male presenting to discuss CT scan of his right shoulder obtained on 8/13 for a shoulder mass.  He denies any new symptoms, changes in size over the past week, motor or sensory changes.  Past Medical History: Past Medical History:  Diagnosis Date  . Depression   . Hyperlipidemia   . Hypotestosteronism   . Major depressive disorder with single episode, in partial remission ([place]) 04/04/2016  . Pituitary microadenoma ([place]) 06/04/2014   Repeat MRI 11/2016 showed no lesion - felt to be volume averaging error on previous scan  . Pituitary tumor    Dx 05/2014  . Restless leg syndrome   . Tachycardia 11/26/2014     Past Surgical History: Past Surgical History:  Procedure Laterality Date  . ANTERIOR CRUCIATE LIGAMENT REPAIR Left   . LAPAROSCOPIC GASTRIC BANDING    . LAPAROSCOPIC REPAIR AND REMOVAL OF GASTRIC BAND  01/2017   removed.  . TENDON REPAIR Right    thumb    Home Medications: Prior to Admission medications   Medication Sig Start Date End Date Taking? Authorizing Provider  atorvastatin (LIPITOR) 40 MG tablet Take 1 tablet (40 mg total) by mouth daily. 01/22/20  Yes Glean Hess, MD  benazepril-hydrochlorthiazide (LOTENSIN HCT) 20-25 MG tablet Take 1 tablet by mouth daily. 01/22/20  Yes Glean Hess, MD  esomeprazole (NEXIUM) 40 MG capsule Take 1 capsule (40 mg total) by mouth daily. 01/22/20  Yes Glean Hess, MD  pramipexole (MIRAPEX) 1 MG tablet Take 2-3 tablets (2-3 mg total) by mouth at bedtime. 01/22/20  Yes Glean Hess, MD  sildenafil (VIAGRA) 100 MG tablet Take 1 tablet (100 mg total) by mouth daily as needed for erectile dysfunction. 01/22/20  Yes Glean Hess, MD    Allergies: Allergies  Allergen Reactions  . Alpha-Gal Anaphylaxis    Can not eat red meat   . Requip [Ropinirole] Nausea Only  . Shellfish Allergy   . Codeine Nausea Only    Review of Systems: Review of  Systems  Constitutional: Negative for chills and fever.  Respiratory: Negative for shortness of breath.   Cardiovascular: Negative for chest pain.  Gastrointestinal: Negative for nausea and vomiting.  Musculoskeletal:       Large right shoulder mass    Physical Exam BP (!) 142/90   Pulse 84   Temp 98 F (36.7 C)   Resp 12   Ht 5\' 10"  (1.778 m)   Wt 244 lb (110.7 kg)   SpO2 98%   BMI 35.01 kg/m  CONSTITUTIONAL: No acute distress Rest of exams deferred as this is appointment was for discussion of CT and further plans.  Labs/Imaging: CT scan right shoulder 02/06/20: IMPRESSION: 1. Large simple lipoma superior to the right shoulder, located between the trapezius and supraspinatus muscles. No aggressive characteristics. 2. No acute osseous findings. Mild glenohumeral and acromioclavicular degenerative changes. 3. No gross impingement on the rotator cuff tendons or focal muscular atrophy.  Assessment and Plan: This is a 44 y.o. male with a large right shoulder lipoma.  --Images reviewed with the patient and he was able to see them as well.  The patient has a large lipoma in the right shoulder that is between the trapezius and the supraspinatus muscles.  The medial edge of the mass also is in proximity to the carotid and jugular bundle on the right side.  My concern is that this would be too complex of  a mass to remove, and he may benefit from referral to surgery at a tertiary center.  Discussed with him that potentially this could be a combined case or he may need plastic surgery instead, but starting with general surgery would be appropriate first step.  The patient has had surgery at [facility] before and he would prefer a referral to them.  We'll do that today and we'll also share the CT scan images with [facility]. --Discussed with him potential symptoms to be aware of, particularly sensory or motor changes given the size of the mass and location. --Follow up prn.    Face-to-face  time spent with the patient and care providers was 25 minutes, with more than 50% of the time spent counseling, educating, and coordinating care of the patient.     Melvyn Neth, [facility]

## 2020-02-09 NOTE — Telephone Encounter (Signed)
Spoke with Caryl Pina in regards to sending a referral to general surgery. Included demo sheet, encounter notes, insurance card. Caryl Pina gave me fax number.     Attempted to send referral to Dr Morey Hummingbird but fax is not going through. Contacted office with no answer. Left vm to call back so I can get another fax number.   O:[phone] Q:[phone]

## 2020-02-09 NOTE — Patient Instructions (Addendum)
We have placed a referral to [facility]. Their office will contact you within 7-10 days to schedule an appointment. If you do not hear from their office please give [place] a call.   Call the office if you have any questions or concerns.   Lipoma Removal  Lipoma removal is a surgical procedure to remove a lipoma, which is a noncancerous (benign) tumor that is made up of fat cells. Most lipomas are small and painless and do not require treatment. They can form in many areas of the body but are most common under the skin of the back, arms, shoulders, buttocks, and thighs. You may need lipoma removal if you have a lipoma that is large, growing, or causing discomfort. Lipoma removal may also be done for cosmetic reasons. Tell a health care provider about:  Any allergies you have.  All medicines you are taking, including vitamins, herbs, eye drops, creams, and over-the-counter medicines.  Any problems you or family members have had with anesthetic medicines.  Any blood disorders you have.  Any surgeries you have had.  Any medical conditions you have.  Whether you are pregnant or may be pregnant. What are the risks? Generally, this is a safe procedure. However, problems may occur, including:  Infection.  Bleeding.  Scarring.  Allergic reactions to medicines.  Damage to nearby structures or organs, such as damage to nerves or blood vessels near the lipoma. What happens before the procedure? Staying hydrated Follow instructions from your health care provider about hydration, which may include:  Up to 2 hours before the procedure - you may continue to drink clear liquids, such as water, clear fruit juice, black coffee, and plain tea. Eating and drinking restrictions Follow instructions from your health care provider about eating and drinking, which may include:  8 hours before the procedure - stop eating heavy meals or foods, such as meat, fried foods, or fatty foods.  6 hours before  the procedure - stop eating light meals or foods, such as toast or cereal.  6 hours before the procedure - stop drinking milk or drinks that contain milk.  2 hours before the procedure - stop drinking clear liquids. Medicines Ask your health care provider about:  Changing or stopping your regular medicines. This is especially important if you are taking diabetes medicines or blood thinners.  Taking medicines such as aspirin and ibuprofen. These medicines can thin your blood. Do not take these medicines unless your health care provider tells you to take them.  Taking over-the-counter medicines, vitamins, herbs, and supplements. General instructions  You will have a physical exam. Your health care provider will check the size of the lipoma and whether it can be moved easily.  You may have a biopsy and imaging tests, such as X-rays, a CT scan, and an MRI.  Do not use any products that contain nicotine or tobacco for at least 4 weeks before the procedure. These products include cigarettes, e-cigarettes, and chewing tobacco. If you need help quitting, ask your health care provider.  Ask your health care provider: ? How your surgery site will be marked. ? What steps will be taken to help prevent infection. These may include:  Washing skin with a germ-killing soap.  Taking antibiotic medicine.  Plan to have someone take you home from the hospital or clinic.  If you will be going home right after the procedure, plan to have someone with you for 24 hours. What happens during the procedure?   An IV will  be inserted into one of your veins.  You will be given one or more of the following: ? A medicine to help you relax (sedative). ? A medicine to numb the area (local anesthetic). ? A medicine to make you fall asleep (general anesthetic). ? A medicine that is injected into an area of your body to numb everything below the injection site (regional anesthetic).  An incision will be made  over the lipoma or very near the lipoma. The incision may be made in a natural skin line or crease.  Tissues, nerves, and blood vessels near the lipoma will be moved out of the way.  The lipoma and the capsule that surrounds it will be separated from the surrounding tissues.  The lipoma will be removed.  The incision may be closed with stitches (sutures).  A bandage (dressing) will be placed over the incision. The procedure may vary among health care providers and hospitals. What happens after the procedure?  Your blood pressure, heart rate, breathing rate, and blood oxygen level will be monitored until you leave the hospital or clinic.  If you were prescribed an antibiotic medicine, use it as told by your health care provider. Do not stop using the antibiotic even if you start to feel better.  If you were given a sedative during the procedure, it can affect you for several hours. Do not drive or operate machinery until your health care provider says that it is safe. Summary  Before the procedure, follow instructions from your health care provider about eating and drinking, and changing or stopping your regular medicines. This is especially important if you are taking diabetes medicines or blood thinners.  After the lipoma is removed, the incision may be closed with stitches (sutures) and covered with a bandage (dressing).  If you were given a sedative during the procedure, it can affect you for several hours. Do not drive or operate machinery until your health care provider says that it is safe. This information is not intended to replace advice given to you by your health care provider. Make sure you discuss any questions you have with your health care provider. Document Revised: 01/27/2019 Document Reviewed: 01/27/2019 Elsevier Patient Education  [place].

## 2020-02-10 NOTE — Telephone Encounter (Signed)
Referral sent, received confirmation sheet. Spoke to [place] from Dr Iline Oven office letting her know about referral. States she will get patient scheduled.

## 2020-07-05 ENCOUNTER — Encounter: Payer: Self-pay | Admitting: Internal Medicine

## 2020-07-05 ENCOUNTER — Other Ambulatory Visit: Payer: Self-pay

## 2020-07-05 ENCOUNTER — Ambulatory Visit (INDEPENDENT_AMBULATORY_CARE_PROVIDER_SITE_OTHER): Payer: PRIVATE HEALTH INSURANCE | Admitting: Internal Medicine

## 2020-07-05 VITALS — BP 128/68 | HR 98 | Temp 98.0°F | Ht 70.0 in | Wt 253.0 lb

## 2020-07-05 DIAGNOSIS — R000 Tachycardia, unspecified: Secondary | ICD-10-CM

## 2020-07-05 DIAGNOSIS — G2581 Restless legs syndrome: Secondary | ICD-10-CM

## 2020-07-05 DIAGNOSIS — I10 Essential (primary) hypertension: Secondary | ICD-10-CM | POA: Diagnosis not present

## 2020-07-05 DIAGNOSIS — E782 Mixed hyperlipidemia: Secondary | ICD-10-CM

## 2020-07-05 MED ORDER — ROSUVASTATIN CALCIUM 5 MG PO TABS: 5.0000 mg | ORAL_TABLET | Freq: Every day | ORAL | 0 refills | Status: DC

## 2020-07-05 NOTE — Progress Notes (Signed)
Date:  07/05/2020   Name:  Kyle Ortega.   DOB:  26-Jun-1976   MRN:  [id number]   Chief Complaint: Hypertension  Hypertension This is a chronic problem. The problem is controlled. Associated symptoms include palpitations. Pertinent negatives include no chest pain, headaches or shortness of breath. Past treatments include ACE inhibitors and diuretics. The current treatment provides moderate improvement. There are no compliance problems.   Hyperlipidemia This is a chronic problem. The problem is uncontrolled. Pertinent negatives include no chest pain or shortness of breath. Current antihyperlipidemic treatment includes statins. The current treatment provides mild improvement of lipids. Compliance problems: had not been taking meds every day.   Tachycardia - he has episodes of fast heart rate most days with minimal exertion.  He has no other symptoms.  No CP, dizziness, SOB etc.  He was seen in the past by Cardiology with Holter - this showed only ST.  Reassurance only.   Lab Results  Component Value Date   CREATININE 0.83 01/22/2020   BUN 14 01/22/2020   NA 139 01/22/2020   K 4.2 01/22/2020   CL 98 01/22/2020   CO2 24 01/22/2020   Lab Results  Component Value Date   CHOL 221 (H) 01/22/2020   HDL 34 (L) 01/22/2020   LDLCALC 139 (H) 01/22/2020   TRIG 266 (H) 01/22/2020   CHOLHDL 6.5 (H) 01/22/2020   Lab Results  Component Value Date   TSH 0.855 10/09/2018   No results found for: HGBA1C Lab Results  Component Value Date   WBC 9.0 01/22/2020   HGB 16.9 01/22/2020   HCT 51.1 (H) 01/22/2020   MCV 83 01/22/2020   PLT 259 01/22/2020   Lab Results  Component Value Date   ALT 29 01/22/2020   AST 20 01/22/2020   ALKPHOS 102 01/22/2020   BILITOT 0.6 01/22/2020     Review of Systems  Constitutional: Negative for appetite change, fatigue and unexpected weight change.  HENT: Negative for trouble swallowing.   Eyes: Negative for visual disturbance.  Respiratory:  Negative for cough, shortness of breath and wheezing.   Cardiovascular: Positive for palpitations. Negative for chest pain and leg swelling.  Gastrointestinal: Negative for abdominal pain.  Genitourinary: Negative for dysuria and hematuria.  Skin: Negative for color change and rash.  Neurological: Negative for dizziness, tremors, syncope, light-headedness, numbness and headaches.  Psychiatric/Behavioral: Positive for sleep disturbance (mild restless leg). Negative for dysphoric mood.    Patient Active Problem List   Diagnosis Date Noted  . Primary insomnia 01/21/2020  . Allergy to galactose-alpha-1,3-galactose 04/22/2018  . Polycythemia 08/31/2017  . GERD without esophagitis 07/16/2017  . Testicular hypofunction 06/29/2016  . Complete tear of MCL of knee, right, sequela 04/03/2016  . Overweight (BMI 25.0-29.9) 03/31/2015  . Restless leg syndrome 03/31/2015  . Essential hypertension 03/31/2015  . Mixed hyperlipidemia 03/31/2015  . History of nephrolithiasis 03/31/2015    Allergies  Allergen Reactions  . Alpha-Gal Anaphylaxis    Can not eat red meat   . Requip [Ropinirole] Nausea Only  . Shellfish Allergy   . Codeine Nausea Only    Past Surgical History:  Procedure Laterality Date  . ANTERIOR CRUCIATE LIGAMENT REPAIR Left   . LAPAROSCOPIC GASTRIC BANDING    . LAPAROSCOPIC REPAIR AND REMOVAL OF GASTRIC BAND  01/2017   removed.  . TENDON REPAIR Right    thumb    Social History   Tobacco Use  . Smoking status: Current Every Day Smoker  Packs/day: 0.50    Years: 5.00    Pack years: 2.50    Types: Cigarettes  . Smokeless tobacco: Never Used  Vaping Use  . Vaping Use: Never used  Substance Use Topics  . Alcohol use: Yes    Alcohol/week: 0.0 standard drinks  . Drug use: No     Medication list has been reviewed and updated.  Current Meds  Medication Sig  . atorvastatin (LIPITOR) 40 MG tablet Take 1 tablet (40 mg total) by mouth daily.  .  benazepril-hydrochlorthiazide (LOTENSIN HCT) 20-25 MG tablet Take 1 tablet by mouth daily.  Marland Kitchen esomeprazole (NEXIUM) 40 MG capsule Take 1 capsule (40 mg total) by mouth daily.  . pramipexole (MIRAPEX) 1 MG tablet Take 2-3 tablets (2-3 mg total) by mouth at bedtime.  . sildenafil (VIAGRA) 100 MG tablet Take 1 tablet (100 mg total) by mouth daily as needed for erectile dysfunction.    PHQ 2/9 Scores 01/22/2020 10/09/2018 04/22/2018 08/31/2017  PHQ - 2 Score 0 0 0 0  PHQ- 9 Score 0 - 0 0    GAD 7 : Generalized Anxiety Score 01/22/2020  Nervous, Anxious, on Edge 0  Control/stop worrying 0  Worry too much - different things 0  Trouble relaxing 1  Restless 0  Easily annoyed or irritable 0  Afraid - awful might happen 0  Total GAD 7 Score 1  Anxiety Difficulty Not difficult at all    BP Readings from Last 3 Encounters:  07/05/20 128/68  02/09/20 (!) 142/90  02/02/20 126/80    Physical Exam Vitals and nursing note reviewed.  Constitutional:      General: He is not in acute distress.    Appearance: He is well-developed.  HENT:     Head: Normocephalic and atraumatic.  Pulmonary:     Effort: Pulmonary effort is normal. No respiratory distress.  Musculoskeletal:        General: Normal range of motion.  Skin:    General: Skin is warm and dry.     Findings: No rash.  Neurological:     Mental Status: He is alert and oriented to person, place, and time.  Psychiatric:        Mood and Affect: Mood and affect normal.        Behavior: Behavior normal.        Thought Content: Thought content normal.     Wt Readings from Last 3 Encounters:  07/05/20 253 lb (114.8 kg)  02/09/20 244 lb (110.7 kg)  02/02/20 244 lb (110.7 kg)    BP 128/68   Pulse 98   Temp 98 F (36.7 C) (Oral)   Ht 5\' 10"  (1.778 m)   Wt 253 lb (114.8 kg)   SpO2 96%   BMI 36.30 kg/m   Assessment and Plan: 1. Essential hypertension Clinically stable exam with well controlled BP. Tolerating medications without  side effects at this time. Pt to continue current regimen and low sodium diet; benefits of regular exercise as able discussed.  2. Mixed hyperlipidemia Intolerant of lipitor due to myalgias Will try low dose Crestor - rosuvastatin (CRESTOR) 5 MG tablet; Take 1 tablet (5 mg total) by mouth daily.  Dispense: 90 tablet; Refill: 0  3. Sinus tachycardia Intermittent, mild without other symptoms Continue to monitor - can refer for Cardiology if worsening  4. Restless leg syndrome Continue current regimen - 2-3 mg nightly   Partially dictated using Editor, commissioning. Any errors are unintentional.  Halina Maidens, MD [facility]  07/05/2020

## 2020-07-14 ENCOUNTER — Encounter: Payer: Self-pay | Admitting: Internal Medicine

## 2021-02-23 ENCOUNTER — Ambulatory Visit (INDEPENDENT_AMBULATORY_CARE_PROVIDER_SITE_OTHER): Payer: PRIVATE HEALTH INSURANCE | Admitting: Internal Medicine

## 2021-02-23 ENCOUNTER — Other Ambulatory Visit: Payer: Self-pay

## 2021-02-23 ENCOUNTER — Encounter: Payer: Self-pay | Admitting: Internal Medicine

## 2021-02-23 VITALS — BP 106/80 | HR 104 | Temp 97.8°F | Ht 70.0 in | Wt 252.0 lb

## 2021-02-23 DIAGNOSIS — Z1211 Encounter for screening for malignant neoplasm of colon: Secondary | ICD-10-CM | POA: Diagnosis not present

## 2021-02-23 DIAGNOSIS — K219 Gastro-esophageal reflux disease without esophagitis: Secondary | ICD-10-CM

## 2021-02-23 DIAGNOSIS — Z0000 Encounter for general adult medical examination without abnormal findings: Secondary | ICD-10-CM | POA: Diagnosis not present

## 2021-02-23 DIAGNOSIS — I10 Essential (primary) hypertension: Secondary | ICD-10-CM

## 2021-02-23 DIAGNOSIS — E782 Mixed hyperlipidemia: Secondary | ICD-10-CM | POA: Diagnosis not present

## 2021-02-23 DIAGNOSIS — N529 Male erectile dysfunction, unspecified: Secondary | ICD-10-CM

## 2021-02-23 DIAGNOSIS — G2581 Restless legs syndrome: Secondary | ICD-10-CM

## 2021-02-23 LAB — POCT URINALYSIS DIPSTICK
Bilirubin, UA: NEGATIVE
Blood, UA: NEGATIVE
Glucose, UA: NEGATIVE
Ketones, UA: NEGATIVE
Leukocytes, UA: NEGATIVE
Nitrite, UA: NEGATIVE
Protein, UA: NEGATIVE
Spec Grav, UA: 1.015 (ref 1.010–1.025)
Urobilinogen, UA: 0.2 E.U./dL
pH, UA: 7 (ref 5.0–8.0)

## 2021-02-23 MED ORDER — SILDENAFIL CITRATE 100 MG PO TABS: 100.0000 mg | ORAL_TABLET | Freq: Every day | ORAL | 3 refills | Status: DC | PRN

## 2021-02-23 MED ORDER — ESOMEPRAZOLE MAGNESIUM 40 MG PO CPDR: 40.0000 mg | DELAYED_RELEASE_CAPSULE | Freq: Every day | ORAL | 3 refills | Status: DC

## 2021-02-23 MED ORDER — BENAZEPRIL-HYDROCHLOROTHIAZIDE 20-25 MG PO TABS: 1.0000 | ORAL_TABLET | Freq: Every day | ORAL | 3 refills | Status: DC

## 2021-02-23 MED ORDER — PRAMIPEXOLE DIHYDROCHLORIDE 1 MG PO TABS: 2.0000 mg | ORAL_TABLET | Freq: Every day | ORAL | 3 refills | Status: DC

## 2021-02-23 NOTE — Progress Notes (Signed)
Date:  02/23/2021   Name:  Kyle Ortega.   DOB:  July 27, 1975   MRN:  [id number]   Chief Complaint: Annual Exam Kyle Ortega. is a 45 y.o. male who presents today for his Complete Annual Exam. He feels well. He reports exercising exercise bike 1-2 days a week. He reports he is sleeping well.   Colonoscopy: none  Immunization History  Administered Date(s) Administered   Influenza,inj,Quad PF,6+ Mos 03/31/2015, 04/04/2016, 04/22/2018   PFIZER(Purple Top)SARS-COV-2 Vaccination 08/18/2019, 09/04/2019   Tdap 03/31/2015, 09/26/2015    Hypertension This is a chronic problem. The problem is controlled. Pertinent negatives include no chest pain, headaches, palpitations or shortness of breath. Past treatments include ACE inhibitors.  Hyperlipidemia This is a chronic problem. The problem is uncontrolled. Pertinent negatives include no chest pain, myalgias or shortness of breath. Current antihyperlipidemic treatment includes diet change (intolerant to atorvastatin; changed to Crestor but did not take; worked harder on diet).  Gastroesophageal Reflux He complains of heartburn. He reports no abdominal pain, no chest pain, no choking or no wheezing. This is a recurrent problem. The problem occurs occasionally. Pertinent negatives include no fatigue. He has tried a PPI for the symptoms.   Lab Results  Component Value Date   CREATININE 0.83 01/22/2020   BUN 14 01/22/2020   NA 139 01/22/2020   K 4.2 01/22/2020   CL 98 01/22/2020   CO2 24 01/22/2020   Lab Results  Component Value Date   CHOL 221 (H) 01/22/2020   HDL 34 (L) 01/22/2020   LDLCALC 139 (H) 01/22/2020   TRIG 266 (H) 01/22/2020   CHOLHDL 6.5 (H) 01/22/2020   Lab Results  Component Value Date   TSH 0.855 10/09/2018   No results found for: HGBA1C Lab Results  Component Value Date   WBC 9.0 01/22/2020   HGB 16.9 01/22/2020   HCT 51.1 (H) 01/22/2020   MCV 83 01/22/2020   PLT 259 01/22/2020   Lab Results   Component Value Date   ALT 29 01/22/2020   AST 20 01/22/2020   ALKPHOS 102 01/22/2020   BILITOT 0.6 01/22/2020     Review of Systems  Constitutional:  Negative for appetite change, chills, diaphoresis, fatigue and unexpected weight change.  HENT:  Negative for hearing loss, tinnitus, trouble swallowing and voice change.   Eyes:  Negative for visual disturbance.  Respiratory:  Negative for choking, shortness of breath and wheezing.   Cardiovascular:  Negative for chest pain, palpitations and leg swelling.  Gastrointestinal:  Positive for heartburn. Negative for abdominal pain, blood in stool, constipation and diarrhea.  Genitourinary:  Negative for difficulty urinating, dysuria and frequency.  Musculoskeletal:  Negative for arthralgias, back pain and myalgias.  Skin:  Negative for color change and rash.  Neurological:  Negative for dizziness, syncope and headaches.  Hematological:  Negative for adenopathy.  Psychiatric/Behavioral:  Negative for dysphoric mood and sleep disturbance. The patient is not nervous/anxious.    Patient Active Problem List   Diagnosis Date Noted   Primary insomnia 01/21/2020   Allergy to galactose-alpha-1,3-galactose 04/22/2018   Polycythemia 08/31/2017   GERD without esophagitis 07/16/2017   Testicular hypofunction 06/29/2016   Complete tear of MCL of knee, right, sequela 04/03/2016   Overweight (BMI 25.0-29.9) 03/31/2015   Restless leg syndrome 03/31/2015   Essential hypertension 03/31/2015   Mixed hyperlipidemia 03/31/2015   History of nephrolithiasis 03/31/2015    Allergies  Allergen Reactions   Alpha-Gal Anaphylaxis    Can not eat  red meat    Requip [Ropinirole] Nausea Only   Shellfish Allergy    Codeine Nausea Only    Past Surgical History:  Procedure Laterality Date   ANTERIOR CRUCIATE LIGAMENT REPAIR Left    LAPAROSCOPIC GASTRIC BANDING     LAPAROSCOPIC REPAIR AND REMOVAL OF GASTRIC BAND  01/2017   removed.   TENDON REPAIR Right     thumb    Social History   Tobacco Use   Smoking status: Every Day    Packs/day: 0.50    Years: 5.00    Pack years: 2.50    Types: Cigarettes   Smokeless tobacco: Never  Vaping Use   Vaping Use: Never used  Substance Use Topics   Alcohol use: Yes    Alcohol/week: 0.0 standard drinks   Drug use: No     Medication list has been reviewed and updated.  Current Meds  Medication Sig   benazepril-hydrochlorthiazide (LOTENSIN HCT) 20-25 MG tablet Take 1 tablet by mouth daily.   esomeprazole (NEXIUM) 40 MG capsule Take 1 capsule (40 mg total) by mouth daily.   pramipexole (MIRAPEX) 1 MG tablet Take 2-3 tablets (2-3 mg total) by mouth at bedtime.   sildenafil (VIAGRA) 100 MG tablet Take 1 tablet (100 mg total) by mouth daily as needed for erectile dysfunction.    PHQ 2/9 Scores 02/23/2021 01/22/2020 10/09/2018 04/22/2018  PHQ - 2 Score 0 0 0 0  PHQ- 9 Score 2 0 - 0    GAD 7 : Generalized Anxiety Score 01/22/2020  Nervous, Anxious, on Edge 0  Control/stop worrying 0  Worry too much - different things 0  Trouble relaxing 1  Restless 0  Easily annoyed or irritable 0  Afraid - awful might happen 0  Total GAD 7 Score 1  Anxiety Difficulty Not difficult at all    BP Readings from Last 3 Encounters:  02/23/21 106/80  07/05/20 128/68  02/09/20 (!) 142/90    Physical Exam Vitals and nursing note reviewed.  Constitutional:      Appearance: Normal appearance. He is well-developed.  HENT:     Head: Normocephalic.     Right Ear: Tympanic membrane, ear canal and external ear normal.     Left Ear: Tympanic membrane, ear canal and external ear normal.     Nose: Nose normal.  Eyes:     Conjunctiva/sclera: Conjunctivae normal.     Pupils: Pupils are equal, round, and reactive to light.  Neck:     Thyroid: No thyromegaly.     Vascular: No carotid bruit.  Cardiovascular:     Rate and Rhythm: Normal rate and regular rhythm.     Heart sounds: Normal heart sounds.  Pulmonary:      Effort: Pulmonary effort is normal.     Breath sounds: Normal breath sounds. No wheezing.  Chest:  Breasts:    Right: No mass.     Left: No mass.  Abdominal:     General: Bowel sounds are normal.     Palpations: Abdomen is soft.     Tenderness: There is no abdominal tenderness.  Musculoskeletal:        General: Normal range of motion.     Cervical back: Normal range of motion and neck supple.     Right lower leg: No edema.     Left lower leg: No edema.  Lymphadenopathy:     Cervical: No cervical adenopathy.  Skin:    General: Skin is warm and dry.     Capillary Refill:  Capillary refill takes less than 2 seconds.     Comments: Scattered cherry hemagiomas  Neurological:     General: No focal deficit present.     Mental Status: He is alert and oriented to person, place, and time.     Deep Tendon Reflexes: Reflexes are normal and symmetric.  Psychiatric:        Attention and Perception: Attention normal.        Mood and Affect: Mood normal.        Thought Content: Thought content normal.    Wt Readings from Last 3 Encounters:  02/23/21 252 lb (114.3 kg)  07/05/20 253 lb (114.8 kg)  02/09/20 244 lb (110.7 kg)    BP 106/80   Pulse (!) 104   Temp 97.8 F (36.6 C) (Oral)   Ht '5\' 10"'$  (1.778 m)   Wt 252 lb (114.3 kg)   SpO2 97%   BMI 36.16 kg/m   Assessment and Plan: 1. Annual physical exam Exam is normal except for weight. Encourage regular exercise and appropriate dietary changes.  2. Colon cancer screening Check on coverage with insurance  3. Essential hypertension Clinically stable exam with well controlled BP. Tolerating medications without side effects at this time. Pt to continue current regimen and low sodium diet; benefits of regular exercise as able discussed. - CBC with Differential/Platelet - Comprehensive metabolic panel - POCT urinalysis dipstick - benazepril-hydrochlorthiazide (LOTENSIN HCT) 20-25 MG tablet; Take 1 tablet by mouth daily.  Dispense:  90 tablet; Refill: 3  4. Mixed hyperlipidemia Check labs and advise - consider Zetia - Lipid panel  5. GERD without esophagitis Symptoms well controlled on daily PPI No red flag signs such as weight loss, n/v, melena Will continue nexium. - CBC with Differential/Platelet - esomeprazole (NEXIUM) 40 MG capsule; Take 1 capsule (40 mg total) by mouth daily.  Dispense: 90 capsule; Refill: 3  6. Restless leg syndrome Doing well on mirapex - TSH - pramipexole (MIRAPEX) 1 MG tablet; Take 2-3 tablets (2-3 mg total) by mouth at bedtime.  Dispense: 270 tablet; Refill: 3  7. Erectile dysfunction, unspecified erectile dysfunction type - sildenafil (VIAGRA) 100 MG tablet; Take 1 tablet (100 mg total) by mouth daily as needed for erectile dysfunction.  Dispense: 20 tablet; Refill: 3   Partially dictated using Editor, commissioning. Any errors are unintentional.  Halina Maidens, MD [facility]  02/23/2021

## 2021-02-24 LAB — CBC WITH DIFFERENTIAL/PLATELET
Basophils Absolute: 0.1 10*3/uL (ref 0.0–0.2)
Basos: 1 %
EOS (ABSOLUTE): 0.1 10*3/uL (ref 0.0–0.4)
Eos: 1 %
Hematocrit: 52.7 % — ABNORMAL HIGH (ref 37.5–51.0)
Hemoglobin: 17.2 g/dL (ref 13.0–17.7)
Immature Grans (Abs): 0 10*3/uL (ref 0.0–0.1)
Immature Granulocytes: 0 %
Lymphocytes Absolute: 3.5 10*3/uL — ABNORMAL HIGH (ref 0.7–3.1)
Lymphs: 38 %
MCH: 26.8 pg (ref 26.6–33.0)
MCHC: 32.6 g/dL (ref 31.5–35.7)
MCV: 82 fL (ref 79–97)
Monocytes Absolute: 0.8 10*3/uL (ref 0.1–0.9)
Monocytes: 8 %
Neutrophils Absolute: 4.7 10*3/uL (ref 1.4–7.0)
Neutrophils: 52 %
Platelets: 268 10*3/uL (ref 150–450)
RBC: 6.41 x10E6/uL — ABNORMAL HIGH (ref 4.14–5.80)
RDW: 13.1 % (ref 11.6–15.4)
WBC: 9.2 10*3/uL (ref 3.4–10.8)

## 2021-02-24 LAB — COMPREHENSIVE METABOLIC PANEL
ALT: 32 IU/L (ref 0–44)
AST: 21 IU/L (ref 0–40)
Albumin/Globulin Ratio: 2 (ref 1.2–2.2)
Albumin: 4.9 g/dL (ref 4.0–5.0)
Alkaline Phosphatase: 100 IU/L (ref 44–121)
BUN/Creatinine Ratio: 20 (ref 9–20)
BUN: 18 mg/dL (ref 6–24)
Bilirubin Total: 0.4 mg/dL (ref 0.0–1.2)
CO2: 26 mmol/L (ref 20–29)
Calcium: 10.3 mg/dL — ABNORMAL HIGH (ref 8.7–10.2)
Chloride: 96 mmol/L (ref 96–106)
Creatinine, Ser: 0.89 mg/dL (ref 0.76–1.27)
Globulin, Total: 2.4 g/dL (ref 1.5–4.5)
Glucose: 101 mg/dL — ABNORMAL HIGH (ref 65–99)
Potassium: 4.3 mmol/L (ref 3.5–5.2)
Sodium: 138 mmol/L (ref 134–144)
Total Protein: 7.3 g/dL (ref 6.0–8.5)
eGFR: 106 mL/min/{1.73_m2} (ref >59)

## 2021-02-24 LAB — LIPID PANEL
Chol/HDL Ratio: 5.5 ratio — ABNORMAL HIGH (ref 0.0–5.0)
Cholesterol, Total: 187 mg/dL (ref 100–199)
HDL: 34 mg/dL — ABNORMAL LOW (ref >39)
LDL Chol Calc (NIH): 120 mg/dL — ABNORMAL HIGH (ref 0–99)
Triglycerides: 187 mg/dL — ABNORMAL HIGH (ref 0–149)
VLDL Cholesterol Cal: 33 mg/dL (ref 5–40)

## 2021-02-24 LAB — TSH: TSH: 0.007 u[IU]/mL — ABNORMAL LOW (ref 0.450–4.500)

## 2021-02-24 NOTE — Progress Notes (Signed)
T3 and T4 added 79.89.  KP

## 2021-03-05 LAB — T4: T4, Total: 10.8 ug/dL (ref 4.5–12.0)

## 2021-03-05 LAB — SPECIMEN STATUS REPORT

## 2021-03-05 LAB — T3: T3, Total: 227 ng/dL — ABNORMAL HIGH (ref 71–180)

## 2021-03-07 NOTE — Progress Notes (Signed)
Called pt left VM to call back. ? ?PEC nurse may give results to patient if they return call to clinic, a CRM has been created. ? ?KP

## 2021-05-13 ENCOUNTER — Encounter: Payer: Self-pay | Admitting: Internal Medicine

## 2021-08-23 ENCOUNTER — Ambulatory Visit: Payer: PRIVATE HEALTH INSURANCE | Admitting: Internal Medicine

## 2021-08-24 ENCOUNTER — Ambulatory Visit (INDEPENDENT_AMBULATORY_CARE_PROVIDER_SITE_OTHER): Payer: PRIVATE HEALTH INSURANCE | Admitting: Internal Medicine

## 2021-08-24 ENCOUNTER — Other Ambulatory Visit: Payer: Self-pay

## 2021-08-24 ENCOUNTER — Encounter: Payer: Self-pay | Admitting: Internal Medicine

## 2021-08-24 VITALS — BP 122/82 | HR 8 | Ht 70.0 in | Wt 260.0 lb

## 2021-08-24 DIAGNOSIS — I10 Essential (primary) hypertension: Secondary | ICD-10-CM

## 2021-08-24 DIAGNOSIS — R7989 Other specified abnormal findings of blood chemistry: Secondary | ICD-10-CM | POA: Insufficient documentation

## 2021-08-24 DIAGNOSIS — G2581 Restless legs syndrome: Secondary | ICD-10-CM | POA: Diagnosis not present

## 2021-08-24 DIAGNOSIS — S83412A Sprain of medial collateral ligament of left knee, initial encounter: Secondary | ICD-10-CM

## 2021-08-24 MED ORDER — PRAMIPEXOLE DIHYDROCHLORIDE 1 MG PO TABS: 4.0000 mg | ORAL_TABLET | Freq: Every day | ORAL | 3 refills | Status: DC

## 2021-08-24 NOTE — Progress Notes (Signed)
? ? ?Date:  08/24/2021  ? ?Name:  Kyle Ortega.   DOB:  [birth year]   MRN:  [id number] ? ? ?Chief Complaint: Hypertension ? ?Hypertension ?This is a chronic problem. The current episode started more than 1 year ago. The problem is unchanged. The problem is controlled. Pertinent negatives include no chest pain, headaches, palpitations or shortness of breath.  ?Abnormal thyroid tests - he had a very low TSH last visit.  T3 was elevated and T4 was normal.  I advised Endocrinology evaluation but he chose not to go.  He feels well - no weight loss, diarrhea, palpitations, sweating, etc. ?RLS - has been controlled on 3 mg mirapex but recently had to increase to 4 mg.  Now having better controlled of symptoms. ?Lab Results  ?Component Value Date  ? NA 138 02/23/2021  ? K 4.3 02/23/2021  ? CO2 26 02/23/2021  ? GLUCOSE 101 (H) 02/23/2021  ? BUN 18 02/23/2021  ? CREATININE 0.89 02/23/2021  ? CALCIUM 10.3 (H) 02/23/2021  ? EGFR 106 02/23/2021  ? GFRNONAA 106 01/22/2020  ? ?Lab Results  ?Component Value Date  ? CHOL 187 02/23/2021  ? HDL 34 (L) 02/23/2021  ? LDLCALC 120 (H) 02/23/2021  ? TRIG 187 (H) 02/23/2021  ? CHOLHDL 5.5 (H) 02/23/2021  ? ?Lab Results  ?Component Value Date  ? TSH 0.007 (L) 02/23/2021  ? ?No results found for: HGBA1C ?Lab Results  ?Component Value Date  ? WBC 9.2 02/23/2021  ? HGB 17.2 02/23/2021  ? HCT 52.7 (H) 02/23/2021  ? MCV 82 02/23/2021  ? PLT 268 02/23/2021  ? ?Lab Results  ?Component Value Date  ? ALT 32 02/23/2021  ? AST 21 02/23/2021  ? ALKPHOS 100 02/23/2021  ? BILITOT 0.4 02/23/2021  ? ?Lab Results  ?Component Value Date  ? VD25OH 35.5 03/31/2015  ?  ? ?Review of Systems  ?Constitutional:  Negative for fatigue and unexpected weight change.  ?HENT:  Negative for nosebleeds and trouble swallowing.   ?Eyes:  Negative for visual disturbance.  ?Respiratory:  Negative for cough, chest tightness, shortness of breath and wheezing.   ?Cardiovascular:  Negative for chest pain, palpitations and leg  swelling.  ?Gastrointestinal:  Negative for abdominal pain, constipation and diarrhea.  ?Endocrine: Negative for cold intolerance and heat intolerance.  ?Musculoskeletal:  Positive for arthralgias (left knee sprain/MCL strain or tear).  ?Skin:  Negative for color change and rash.  ?Neurological:  Negative for dizziness, weakness, light-headedness and headaches.  ?Psychiatric/Behavioral:  Negative for dysphoric mood and sleep disturbance. The patient is not nervous/anxious.   ? ?Patient Active Problem List  ? Diagnosis Date Noted  ? Primary insomnia 01/21/2020  ? Allergy to galactose-alpha-1,3-galactose 04/22/2018  ? Polycythemia 08/31/2017  ? GERD without esophagitis 07/16/2017  ? Testicular hypofunction 06/29/2016  ? Complete tear of MCL of knee, right, sequela 04/03/2016  ? Overweight (BMI 25.0-29.9) 03/31/2015  ? Restless leg syndrome 03/31/2015  ? Essential hypertension 03/31/2015  ? Mixed hyperlipidemia 03/31/2015  ? History of nephrolithiasis 03/31/2015  ? ? ?Allergies  ?Allergen Reactions  ? Alpha-Gal Anaphylaxis  ?  Can not eat red meat   ? Requip [Ropinirole] Nausea Only  ? Shellfish Allergy   ? Codeine Nausea Only  ? ? ?Past Surgical History:  ?Procedure Laterality Date  ? ANTERIOR CRUCIATE LIGAMENT REPAIR Left   ? LAPAROSCOPIC GASTRIC BANDING    ? LAPAROSCOPIC REPAIR AND REMOVAL OF GASTRIC BAND  01/2017  ? removed.  ? TENDON REPAIR Right   ?  thumb  ? ? ?Social History  ? ?Tobacco Use  ? Smoking status: Every Day  ?  Packs/day: 0.50  ?  Years: 5.00  ?  Pack years: 2.50  ?  Types: Cigarettes  ? Smokeless tobacco: Never  ?Vaping Use  ? Vaping Use: Never used  ?Substance Use Topics  ? Alcohol use: Yes  ?  Alcohol/week: 0.0 standard drinks  ? Drug use: No  ? ? ? ?Medication list has been reviewed and updated. ? ?Current Meds  ?Medication Sig  ? benazepril-hydrochlorthiazide (LOTENSIN HCT) 20-25 MG tablet Take 1 tablet by mouth daily.  ? esomeprazole (NEXIUM) 40 MG capsule Take 1 capsule (40 mg total) by mouth  daily.  ? sildenafil (VIAGRA) 100 MG tablet Take 1 tablet (100 mg total) by mouth daily as needed for erectile dysfunction.  ? [DISCONTINUED] pramipexole (MIRAPEX) 1 MG tablet Take 2-3 tablets (2-3 mg total) by mouth at bedtime.  ? ? ?PHQ 2/9 Scores 08/24/2021 02/23/2021 01/22/2020 10/09/2018  ?PHQ - 2 Score 0 0 0 0  ?PHQ- 9 Score 0 2 0 -  ? ? ?GAD 7 : Generalized Anxiety Score 08/24/2021 02/23/2021 01/22/2020  ?Nervous, Anxious, on Edge 0 0 0  ?Control/stop worrying 0 0 0  ?Worry too much - different things 0 0 0  ?Trouble relaxing $RemoveBeforeDEI'2 1 1  'dIdeDqxVfTVnJtzn$ ?Restless 2 1 0  ?Easily annoyed or irritable 0 0 0  ?Afraid - awful might happen 0 0 0  ?Total GAD 7 Score $Remov'4 2 1  'OSeAyS$ ?Anxiety Difficulty Not difficult at all - Not difficult at all  ? ? ?BP Readings from Last 3 Encounters:  ?08/24/21 122/82  ?02/23/21 106/80  ?07/05/20 128/68  ? ? ?Physical Exam ?Vitals and nursing note reviewed.  ?Constitutional:   ?   General: He is not in acute distress. ?   Appearance: Normal appearance. He is well-developed.  ?HENT:  ?   Head: Normocephalic and atraumatic.  ?Neck:  ?   Vascular: No carotid bruit.  ?Cardiovascular:  ?   Rate and Rhythm: Normal rate and regular rhythm.  ?   Pulses: Normal pulses.  ?   Heart sounds: No murmur heard. ?Pulmonary:  ?   Effort: Pulmonary effort is normal. No respiratory distress.  ?   Breath sounds: No wheezing or rhonchi.  ?Musculoskeletal:     ?   General: Tenderness and signs of injury present.  ?   Cervical back: Normal range of motion.  ?   Right lower leg: No edema.  ?   Left lower leg: Edema present.  ?Lymphadenopathy:  ?   Cervical: No cervical adenopathy.  ?Skin: ?   General: Skin is warm and dry.  ?   Findings: No rash.  ?Neurological:  ?   General: No focal deficit present.  ?   Mental Status: He is alert and oriented to person, place, and time.  ?   Gait: Gait normal.  ?Psychiatric:     ?   Mood and Affect: Mood normal.     ?   Behavior: Behavior normal.  ? ? ?Wt Readings from Last 3 Encounters:  ?08/24/21 260 lb  (117.9 kg)  ?02/23/21 252 lb (114.3 kg)  ?07/05/20 253 lb (114.8 kg)  ? ? ?BP 122/82 (BP Location: Right Arm, Cuff Size: Large)   Pulse (!) 8   Ht $R'5\' 10"'sS$  (1.778 m)   Wt 260 lb (117.9 kg)   SpO2 97%   BMI 37.31 kg/m?  ? ?Assessment and Plan: ?1. Essential hypertension ?  Clinically stable exam with well controlled BP. ?Tolerating medications without side effects at this time. ?Pt to continue current regimen and low sodium diet; benefits of regular exercise as able discussed.  Now on a better diet since he became engaged to a dietician. ? ?2. Low TSH level ?He denies any symptoms of hyperthyroidism. ?Will repeat labs and then advise ?- TSH+T4F+T3Free ? ?3. Restless leg syndrome ?Controlled on high dose mirapex currently ?If he has breakthrough on this, will need to reassess. ?- pramipexole (MIRAPEX) 1 MG tablet; Take 4 tablets (4 mg total) by mouth at bedtime.  Dispense: 360 tablet; Refill: 3 ? ? ?Partially dictated using Editor, commissioning. Any errors are unintentional. ? ?Halina Maidens, MD ?[facility] ?[facility] ? ?08/24/2021 ? ? ? ? ? ?

## 2021-09-23 LAB — TSH+T4F+T3FREE
Free T4: 1.06 ng/dL (ref 0.82–1.77)
T3, Free: 3.8 pg/mL (ref 2.0–4.4)
TSH: 0.753 u[IU]/mL (ref 0.450–4.500)

## 2021-10-03 ENCOUNTER — Telehealth: Payer: Self-pay | Admitting: Internal Medicine

## 2021-10-03 ENCOUNTER — Encounter: Payer: Self-pay | Admitting: Internal Medicine

## 2021-10-03 ENCOUNTER — Ambulatory Visit (INDEPENDENT_AMBULATORY_CARE_PROVIDER_SITE_OTHER): Payer: PRIVATE HEALTH INSURANCE | Admitting: Internal Medicine

## 2021-10-03 VITALS — BP 138/88 | HR 90 | Temp 98.2°F | Ht 70.0 in | Wt 264.0 lb

## 2021-10-03 DIAGNOSIS — H60312 Diffuse otitis externa, left ear: Secondary | ICD-10-CM | POA: Diagnosis not present

## 2021-10-03 MED ORDER — AMOXICILLIN-POT CLAVULANATE 875-125 MG PO TABS: 1.0000 | ORAL_TABLET | Freq: Two times a day (BID) | ORAL | 0 refills | Status: AC

## 2021-10-03 MED ORDER — CIPROFLOXACIN-HYDROCORTISONE 0.2-1 % OT SUSP: 3.0000 [drp] | Freq: Two times a day (BID) | OTIC | 0 refills | Status: DC

## 2021-10-03 NOTE — Telephone Encounter (Signed)
Signed accidentally! Please advise (see below) ? ?

## 2021-10-03 NOTE — Progress Notes (Signed)
? ? ?Date:  10/03/2021  ? ?Name:  Kyle Ortega.   DOB:  [birth year]   MRN:  [id number] ? ? ?Chief Complaint: Ear Pain ? ?Otalgia  ?There is pain in the left ear. This is a new problem. Episode onset: 2 days ago. The problem occurs constantly. The problem has been gradually worsening. There has been no fever. The pain is at a severity of 6/10. The pain is moderate. Associated symptoms include ear discharge. Pertinent negatives include no coughing or sore throat. He has tried NSAIDs for the symptoms. The treatment provided mild relief.  ? ?Lab Results  ?Component Value Date  ? NA 138 02/23/2021  ? K 4.3 02/23/2021  ? CO2 26 02/23/2021  ? GLUCOSE 101 (H) 02/23/2021  ? BUN 18 02/23/2021  ? CREATININE 0.89 02/23/2021  ? CALCIUM 10.3 (H) 02/23/2021  ? EGFR 106 02/23/2021  ? GFRNONAA 106 01/22/2020  ? ?Lab Results  ?Component Value Date  ? CHOL 187 02/23/2021  ? HDL 34 (L) 02/23/2021  ? LDLCALC 120 (H) 02/23/2021  ? TRIG 187 (H) 02/23/2021  ? CHOLHDL 5.5 (H) 02/23/2021  ? ?Lab Results  ?Component Value Date  ? TSH 0.753 09/22/2021  ? ?No results found for: HGBA1C ?Lab Results  ?Component Value Date  ? WBC 9.2 02/23/2021  ? HGB 17.2 02/23/2021  ? HCT 52.7 (H) 02/23/2021  ? MCV 82 02/23/2021  ? PLT 268 02/23/2021  ? ?Lab Results  ?Component Value Date  ? ALT 32 02/23/2021  ? AST 21 02/23/2021  ? ALKPHOS 100 02/23/2021  ? BILITOT 0.4 02/23/2021  ? ?Lab Results  ?Component Value Date  ? VD25OH 35.5 03/31/2015  ?  ? ?Review of Systems  ?Constitutional:  Negative for chills, fatigue and fever.  ?HENT:  Positive for ear discharge, ear pain and sinus pressure. Negative for facial swelling, mouth sores, nosebleeds, postnasal drip, sore throat and trouble swallowing.   ?Respiratory:  Negative for cough.   ?Allergic/Immunologic: Positive for environmental allergies.  ?Psychiatric/Behavioral:  Positive for sleep disturbance. Negative for dysphoric mood. The patient is not nervous/anxious.   ? ?Patient Active Problem List  ?  Diagnosis Date Noted  ? Sprain of medial collateral ligament of left knee 08/24/2021  ? Low TSH level 08/24/2021  ? Primary insomnia 01/21/2020  ? Allergy to galactose-alpha-1,3-galactose 04/22/2018  ? Polycythemia 08/31/2017  ? GERD without esophagitis 07/16/2017  ? Testicular hypofunction 06/29/2016  ? Complete tear of MCL of knee, right, sequela 04/03/2016  ? Overweight (BMI 25.0-29.9) 03/31/2015  ? Restless leg syndrome 03/31/2015  ? Essential hypertension 03/31/2015  ? Mixed hyperlipidemia 03/31/2015  ? History of nephrolithiasis 03/31/2015  ? ? ?Allergies  ?Allergen Reactions  ? Alpha-Gal Anaphylaxis  ?  Can not eat red meat   ? Requip [Ropinirole] Nausea Only  ? Shellfish Allergy   ? Codeine Nausea Only  ? ? ?Past Surgical History:  ?Procedure Laterality Date  ? ANTERIOR CRUCIATE LIGAMENT REPAIR Left   ? LAPAROSCOPIC GASTRIC BANDING    ? LAPAROSCOPIC REPAIR AND REMOVAL OF GASTRIC BAND  01/2017  ? removed.  ? TENDON REPAIR Right   ? thumb  ? ? ?Social History  ? ?Tobacco Use  ? Smoking status: Every Day  ?  Packs/day: 0.50  ?  Years: 5.00  ?  Pack years: 2.50  ?  Types: Cigarettes  ? Smokeless tobacco: Never  ?Vaping Use  ? Vaping Use: Never used  ?Substance Use Topics  ? Alcohol use: Yes  ?  Alcohol/week:  0.0 standard drinks  ? Drug use: No  ? ? ? ?Medication list has been reviewed and updated. ? ?Current Meds  ?Medication Sig  ? benazepril-hydrochlorthiazide (LOTENSIN HCT) 20-25 MG tablet Take 1 tablet by mouth daily.  ? esomeprazole (NEXIUM) 40 MG capsule Take 1 capsule (40 mg total) by mouth daily.  ? pramipexole (MIRAPEX) 1 MG tablet Take 4 tablets (4 mg total) by mouth at bedtime.  ? sildenafil (VIAGRA) 100 MG tablet Take 1 tablet (100 mg total) by mouth daily as needed for erectile dysfunction.  ? ? ? ?  10/03/2021  ?  4:05 PM 08/24/2021  ? 11:11 AM 02/23/2021  ?  8:05 AM 01/22/2020  ?  9:32 AM  ?GAD 7 : Generalized Anxiety Score  ?Nervous, Anxious, on Edge 0 0 0 0  ?Control/stop worrying 0 0 0 0  ?Worry too  much - different things 0 0 0 0  ?Trouble relaxing 0 $RemoveBe'2 1 1  'tEhUNTZCM$ ?Restless 0 2 1 0  ?Easily annoyed or irritable 0 0 0 0  ?Afraid - awful might happen 0 0 0 0  ?Total GAD 7 Score 0 $Remov'4 2 1  'rqTHwM$ ?Anxiety Difficulty Not difficult at all Not difficult at all  Not difficult at all  ? ? ? ?  10/03/2021  ?  4:04 PM  ?Depression screen PHQ 2/9  ?Decreased Interest 0  ?Down, Depressed, Hopeless 0  ?PHQ - 2 Score 0  ?Altered sleeping 0  ?Tired, decreased energy 0  ?Change in appetite 0  ?Feeling bad or failure about yourself  0  ?Trouble concentrating 0  ?Moving slowly or fidgety/restless 0  ?Suicidal thoughts 0  ?PHQ-9 Score 0  ?Difficult doing work/chores Not difficult at all  ? ? ?BP Readings from Last 3 Encounters:  ?10/03/21 138/88  ?08/24/21 122/82  ?02/23/21 106/80  ? ? ?Physical Exam ?Vitals and nursing note reviewed.  ?Constitutional:   ?   General: He is not in acute distress. ?   Appearance: He is well-developed.  ?HENT:  ?   Head: Normocephalic and atraumatic.  ?   Right Ear: No middle ear effusion. Tympanic membrane is retracted. Tympanic membrane is not perforated or erythematous.  ?   Left Ear: Drainage present. A middle ear effusion is present. There is mastoid tenderness.  ?   Nose:  ?   Right Sinus: No maxillary sinus tenderness or frontal sinus tenderness.  ?   Left Sinus: No maxillary sinus tenderness or frontal sinus tenderness.  ?Pulmonary:  ?   Effort: Pulmonary effort is normal. No respiratory distress.  ?Skin: ?   General: Skin is warm and dry.  ?   Findings: No rash.  ?Neurological:  ?   Mental Status: He is alert and oriented to person, place, and time.  ?Psychiatric:     ?   Mood and Affect: Mood normal.     ?   Behavior: Behavior normal.  ? ? ?Wt Readings from Last 3 Encounters:  ?10/03/21 264 lb (119.7 kg)  ?08/24/21 260 lb (117.9 kg)  ?02/23/21 252 lb (114.3 kg)  ? ? ?BP 138/88   Pulse 90   Temp 98.2 ?F (36.8 ?C) (Oral)   Ht $R'5\' 10"'kS$  (1.778 m)   Wt 264 lb (119.7 kg)   SpO2 97%   BMI 37.88 kg/m?   ? ?Assessment and Plan: ?1. Acute diffuse otitis externa of left ear ?Continue Advil as needed for pain ?Use Flonase and Claritin for allergy symptoms ?Otc Coricidin HBP ?- amoxicillin-clavulanate (AUGMENTIN)  875-125 MG tablet; Take 1 tablet by mouth 2 (two) times daily for 10 days.  Dispense: 20 tablet; Refill: 0 ?- ciprofloxacin-hydrocortisone (CIPRO HC OTIC) OTIC suspension; Place 3 drops into the left ear 2 (two) times daily.  Dispense: 10 mL; Refill: 0 ? ? ?Partially dictated using Editor, commissioning. Any errors are unintentional. ? ?Halina Maidens, MD ?[facility] ?[facility] ? ?10/03/2021 ? ? ? ? ? ?

## 2021-10-03 NOTE — Patient Instructions (Signed)
Coricidin HBP for congestion ? ?Continue nasal spray ? ?Advil for pain ?

## 2021-10-03 NOTE — Telephone Encounter (Signed)
Kyle Ortega from [facility] in [place] called to see if PCP could change Rx to [facility] instead of the current Rx. She does not have that in stock and it is too expensive ?[facility] [name] [place]  ?[place] [address]  ?Phone: [phone] Fax: [phone]  ?ciprofloxacin-hydrocortisone (CIPRO HC OTIC) OTIC suspension  ?

## 2021-10-04 ENCOUNTER — Other Ambulatory Visit: Payer: Self-pay | Admitting: Internal Medicine

## 2021-10-04 DIAGNOSIS — H60312 Diffuse otitis externa, left ear: Secondary | ICD-10-CM

## 2021-10-04 MED ORDER — CIPROFLOXACIN-DEXAMETHASONE 0.3-0.1 % OT SUSP: 4.0000 [drp] | Freq: Two times a day (BID) | OTIC | 0 refills | Status: DC

## 2021-10-04 NOTE — Telephone Encounter (Signed)
Please review.  KP

## 2021-10-04 NOTE — Telephone Encounter (Signed)
Pt informed a new medication was sent in. ? ?KP ?

## 2021-10-07 ENCOUNTER — Encounter: Payer: Self-pay | Admitting: Internal Medicine

## 2021-10-07 ENCOUNTER — Other Ambulatory Visit: Payer: Self-pay | Admitting: Internal Medicine

## 2021-10-07 DIAGNOSIS — H60312 Diffuse otitis externa, left ear: Secondary | ICD-10-CM

## 2021-10-07 MED ORDER — OFLOXACIN 0.3 % OT SOLN: 5.0000 [drp] | Freq: Every day | OTIC | 0 refills | Status: DC

## 2021-10-15 ENCOUNTER — Ambulatory Visit
Admission: RE | Admit: 2021-10-15 | Discharge: 2021-10-15 | Disposition: A | Discharge status: Home / Self Care | Payer: PRIVATE HEALTH INSURANCE | Source: Ambulatory Visit | Attending: Emergency Medicine | Admitting: Emergency Medicine

## 2021-10-15 VITALS — BP 131/86 | HR 107 | Temp 98.5°F | Resp 18

## 2021-10-15 DIAGNOSIS — H60392 Other infective otitis externa, left ear: Secondary | ICD-10-CM | POA: Diagnosis not present

## 2021-10-15 DIAGNOSIS — K591 Functional diarrhea: Secondary | ICD-10-CM

## 2021-10-15 LAB — URINALYSIS, ROUTINE W REFLEX MICROSCOPIC
Bilirubin Urine: NEGATIVE
Glucose, UA: NEGATIVE mg/dL
Hgb urine dipstick: NEGATIVE
Ketones, ur: NEGATIVE mg/dL
Leukocytes,Ua: NEGATIVE
Nitrite: NEGATIVE
Protein, ur: 30 mg/dL — AB
Specific Gravity, Urine: 1.015 (ref 1.005–1.030)
pH: 5 (ref 5.0–8.0)

## 2021-10-15 LAB — URINALYSIS, MICROSCOPIC (REFLEX)
Bacteria, UA: NONE SEEN
RBC / HPF: NONE SEEN RBC/hpf (ref 0–5)

## 2021-10-15 MED ORDER — LOPERAMIDE HCL 2 MG PO TABS: 2.0000 mg | ORAL_TABLET | Freq: Four times a day (QID) | ORAL | 0 refills | Status: DC | PRN

## 2021-10-15 MED ORDER — PROBIOTIC 250 MG PO CAPS: 250.0000 mg | ORAL_CAPSULE | Freq: Two times a day (BID) | ORAL | 0 refills | Status: AC

## 2021-10-15 MED ORDER — DICYCLOMINE HCL 20 MG PO TABS: 20.0000 mg | ORAL_TABLET | Freq: Two times a day (BID) | ORAL | 0 refills | Status: DC

## 2021-10-15 MED ORDER — OFLOXACIN 0.3 % OT SOLN: 10.0000 [drp] | Freq: Two times a day (BID) | OTIC | 0 refills | Status: AC

## 2021-10-15 NOTE — ED Provider Notes (Signed)
? ?[facility] ?Provider Note ? ?Patient Contact: 12:48 PM (approximate) ? ? ?History  ? ?Abdominal Pain (Lower abdominal pain and diarrhea for 5 days. - Entered by patient) and Otalgia ? ? ?HPI ? ?Kyle L [place]. is a 47 y.o. male who presents to the urgent care complaining of abdominal cramping, diarrhea, 1 episode of emesis.  Patient is also complaining of ongoing left ear pain.  Patient was placed on antibiotics for otitis media and otitis externa.  He had difficulty picking up his antibiotic eardrops as he had 2 prescriptions that were over $150 apiece.  Patient finally was able to pick up ofloxacin drops and is just started on same.  Patient is also concerned as she is having lower abdominal cramping, diarrhea.  He has had 1 episode of emesis.  Diarrhea started on day 7 of 10 of his Augmentin.  Patient has had ongoing symptoms.  No fevers, bloody diarrhea.  There was no blood in the emesis.  No history of IBS, Crohn's, colitis or diverticulitis. ?  ? ? ?Physical Exam  ? ?Triage Vital Signs: ?ED Triage Vitals  ?Enc Vitals Group  ?   BP   ?   Pulse   ?   Resp   ?   Temp   ?   Temp src   ?   SpO2   ?   Weight   ?   Height   ?   Head Circumference   ?   Peak Flow   ?   Pain Score   ?   Pain Loc   ?   Pain Edu?   ?   Excl. in [place]?   ? ? ?Most recent vital signs: ?Vitals:  ? 10/15/21 1249  ?BP: 131/86  ?Pulse: (!) 107  ?Resp: 18  ?Temp: 98.5 ?F (36.9 ?C)  ?SpO2: 98%  ? ? ? ?General: Alert and in no acute distress. ? ? ?ENT: ?     Ears: EAC and TM on right is unremarkable.  EAC on the left is grossly edematous, erythematous.  Findings consistent with otitis externa to the point unable to visualize TM. ?     Nose: No congestion/rhinnorhea. ?     Mouth/Throat: Mucous membranes are moist  ?Cardiovascular:  Good peripheral perfusion ?Respiratory: Normal respiratory effort without tachypnea or retractions. Lungs CTAB. Good air entry to the bases with no decreased or absent breath  sounds. ?Gastrointestinal: Bowel sounds ?4 quadrants. Soft and nontender to palpation. No guarding or rigidity. No palpable masses. No distention. No CVA tenderness. ?Musculoskeletal: Full range of motion to all extremities.  ?Neurologic:  No gross focal neurologic deficits are appreciated.  ?Skin:   No rash noted ?Other: ? ? ?ED Results / Procedures / Treatments  ? ?Labs ?(all labs ordered are listed, but only abnormal results are displayed) ?Labs Reviewed  ?URINALYSIS, ROUTINE W REFLEX MICROSCOPIC - Abnormal; Notable for the following components:  ?    Result Value  ? Protein, ur 30 (*)   ? All other components within normal limits  ?URINALYSIS, MICROSCOPIC (REFLEX)  ? ? ? ?EKG ? ? ? ? ?RADIOLOGY ? ? ? ?No results found. ? ?PROCEDURES: ? ?Critical Care performed: No ? ?Procedures ? ? ?MEDICATIONS ORDERED IN ED: ?Medications - No data to display ? ? ?IMPRESSION / MDM / ASSESSMENT AND PLAN / ED COURSE  ?I reviewed the triage vital signs and the nursing notes. ?             ?               ? ?  Differential diagnosis includes, but is not limited to, colitis, Crohn's, IBS, diverticulitis, antibiotic side effect, otitis media, otitis externa, eustachian tube dysfunction ? ? ?Patient's diagnosis is consistent with diarrhea, otitis externa.  Patient presents to the urgent care with left ear pain, abdominal cramping and diarrhea.  Patient is already been diagnosed with otitis externa and otitis media.  He was on Augmentin for 10 days and is just started ofloxacin for his otitis externa.  On day 7 of antibiotics patient started having abdominal cramping and diarrhea.  He has had 1 episode of emesis in the last 5 days.  No fevers associated with this complaint.  No blood in the diarrhea.  Overall exam is reassuring with no significant tenderness.  I discussed that in urgent care setting we have limited labs and imaging capability and while I suspect that this is secondary to his antibiotic usage I cannot fully rule out other  sources of abdominal cramping and diarrhea.  Patient verbalizes understanding of same but states that he believes it is likely secondary to his antibiotics.  I will place the patient on Bentyl, loperamide encourage probiotic use.  Patient states that he does not believe he has enough antibiotic eardrop to complete a 7-day course of his ofloxacin for his ongoing otitis externa.  I will prescribe additional antibiotic eardrop for the patient.  Patient has fevers, worsening pain, ongoing symptoms despite treatment I recommend follow-up in the emergency department for labs and imaging.  Patient is agreeable with this plan.  Follow-up primary care as needed..Patient is given ED precautions to return to the ED for any worsening or new symptoms. ? ? ? ?  ? ? ?FINAL CLINICAL IMPRESSION(S) / ED DIAGNOSES  ? ?Final diagnoses:  ?Functional diarrhea  ?Infective otitis externa of left ear  ? ? ? ?Rx / DC Orders  ? ?ED Discharge Orders   ? ?      Ordered  ?  Saccharomyces boulardii (PROBIOTIC) 250 MG CAPS  2 times daily       ? 10/15/21 1318  ?  loperamide (IMODIUM A-D) 2 MG tablet  4 times daily PRN       ? 10/15/21 1318  ?  ofloxacin (FLOXIN) 0.3 % OTIC solution  2 times daily       ? 10/15/21 1318  ?  dicyclomine (BENTYL) 20 MG tablet  2 times daily       ? 10/15/21 1318  ? ?  ?  ? ?  ? ? ? ?Note:  This document was prepared using Dragon voice recognition software and may include unintentional dictation errors. ?  ?Darletta Moll, PA-C ?10/15/21 1323 ? ?

## 2021-10-15 NOTE — ED Triage Notes (Signed)
Pt present lower abdomen pain with diarrhea and left ear pain. Symptoms for the stomach pain started on Monday. Pt is experience nausea and vomiting.  Pt was previously seen at his pcp for the ear pain. Pt was prescribe antibiotics for his ear but still having some issues.  ?

## 2021-10-31 ENCOUNTER — Encounter: Payer: Self-pay | Admitting: Internal Medicine

## 2021-10-31 ENCOUNTER — Other Ambulatory Visit: Payer: Self-pay | Admitting: Internal Medicine

## 2021-10-31 ENCOUNTER — Telehealth: Payer: Self-pay

## 2021-10-31 DIAGNOSIS — A0472 Enterocolitis due to Clostridium difficile, not specified as recurrent: Secondary | ICD-10-CM

## 2021-10-31 MED ORDER — VANCOMYCIN HCL 125 MG PO CAPS: 125.0000 mg | ORAL_CAPSULE | Freq: Four times a day (QID) | ORAL | 0 refills | Status: AC

## 2021-10-31 NOTE — Telephone Encounter (Signed)
Copied from [place] [id number]. Topic: General - Other ?>> Oct 31, 2021 11:29 AM Yvette Rack wrote: ?Reason for CRM: Pt stated he sent Dr. Army Melia a message through [facility] and he needed to speak with her or her nurse. Pt requests call back asap. Cb# [phone] ?

## 2021-10-31 NOTE — Telephone Encounter (Signed)
Dr Army Melia has not reviewed the message yet. She will review shortly and reply to patient. ?

## 2021-11-01 ENCOUNTER — Other Ambulatory Visit: Payer: Self-pay

## 2021-11-01 MED ORDER — DICYCLOMINE HCL 20 MG PO TABS: 20.0000 mg | ORAL_TABLET | Freq: Two times a day (BID) | ORAL | 0 refills | Status: DC

## 2021-11-03 ENCOUNTER — Ambulatory Visit (INDEPENDENT_AMBULATORY_CARE_PROVIDER_SITE_OTHER): Payer: PRIVATE HEALTH INSURANCE | Admitting: Internal Medicine

## 2021-11-03 ENCOUNTER — Encounter: Payer: Self-pay | Admitting: Internal Medicine

## 2021-11-03 VITALS — BP 134/76 | HR 110 | Temp 98.5°F | Ht 70.0 in | Wt 252.0 lb

## 2021-11-03 DIAGNOSIS — H60312 Diffuse otitis externa, left ear: Secondary | ICD-10-CM | POA: Diagnosis not present

## 2021-11-03 DIAGNOSIS — A0472 Enterocolitis due to Clostridium difficile, not specified as recurrent: Secondary | ICD-10-CM | POA: Diagnosis not present

## 2021-11-03 MED ORDER — OFLOXACIN 0.3 % OT SOLN: 5.0000 [drp] | Freq: Every day | OTIC | 0 refills | Status: DC

## 2021-11-03 NOTE — Progress Notes (Signed)
? ? ?Date:  11/03/2021  ? ?Name:  Kyle Ortega.   DOB:  1975-09-02   MRN:  [id number] ? ? ?Chief Complaint: Ear Pain (Left Ear. Tried augmentin for 9 days and now has CDIFF, and tried 2 rounds of ear drops with no relief. ) ? ?Ear Fullness  ?There is pain in the left ear. The current episode started 1 to 4 weeks ago. The problem occurs constantly. The problem has been unchanged. There has been no fever. Associated symptoms include diarrhea and hearing loss (and ear fullness). Pertinent negatives include no abdominal pain, coughing or vomiting. Associated symptoms comments: Ear pain much improved after Augmentin and Ofloxin drops.  He also went to ED and had a wick placed - it has since fallen out.Marland Kitchen  ?Diarrhea  ?This is a new (positive for C Diff at the ED) problem. The current episode started 1 to 4 weeks ago. The problem occurs 2 to 4 times per day. The problem has been unchanged. The stool consistency is described as Mucous and watery. The patient states that diarrhea awakens him from sleep. Pertinent negatives include no abdominal pain, chills, coughing, fever or vomiting.  ? ?Lab Results  ?Component Value Date  ? NA 138 02/23/2021  ? K 4.3 02/23/2021  ? CO2 26 02/23/2021  ? GLUCOSE 101 (H) 02/23/2021  ? BUN 18 02/23/2021  ? CREATININE 0.89 02/23/2021  ? CALCIUM 10.3 (H) 02/23/2021  ? EGFR 106 02/23/2021  ? GFRNONAA 106 01/22/2020  ? ?Lab Results  ?Component Value Date  ? CHOL 187 02/23/2021  ? HDL 34 (L) 02/23/2021  ? LDLCALC 120 (H) 02/23/2021  ? TRIG 187 (H) 02/23/2021  ? CHOLHDL 5.5 (H) 02/23/2021  ? ?Lab Results  ?Component Value Date  ? TSH 0.753 09/22/2021  ? ?No results found for: HGBA1C ?Lab Results  ?Component Value Date  ? WBC 9.2 02/23/2021  ? HGB 17.2 02/23/2021  ? HCT 52.7 (H) 02/23/2021  ? MCV 82 02/23/2021  ? PLT 268 02/23/2021  ? ?Lab Results  ?Component Value Date  ? ALT 32 02/23/2021  ? AST 21 02/23/2021  ? ALKPHOS 100 02/23/2021  ? BILITOT 0.4 02/23/2021  ? ?Lab Results  ?Component Value  Date  ? VD25OH 35.5 03/31/2015  ?  ? ?Review of Systems  ?Constitutional:  Negative for chills, fatigue and fever.  ?HENT:  Positive for hearing loss (and ear fullness).   ?Respiratory:  Negative for cough, chest tightness and shortness of breath.   ?Cardiovascular:  Negative for chest pain.  ?Gastrointestinal:  Positive for blood in stool and diarrhea. Negative for abdominal pain, nausea and vomiting.  ? ?Patient Active Problem List  ? Diagnosis Date Noted  ? Sprain of medial collateral ligament of left knee 08/24/2021  ? Low TSH level 08/24/2021  ? Primary insomnia 01/21/2020  ? Allergy to galactose-alpha-1,3-galactose 04/22/2018  ? Polycythemia 08/31/2017  ? GERD without esophagitis 07/16/2017  ? Testicular hypofunction 06/29/2016  ? Complete tear of MCL of knee, right, sequela 04/03/2016  ? Overweight (BMI 25.0-29.9) 03/31/2015  ? Restless leg syndrome 03/31/2015  ? Essential hypertension 03/31/2015  ? Mixed hyperlipidemia 03/31/2015  ? History of nephrolithiasis 03/31/2015  ? ? ?Allergies  ?Allergen Reactions  ? Alpha-Gal Anaphylaxis  ?  Can not eat red meat   ? Augmentin [Amoxicillin-Pot Clavulanate] Diarrhea  ?  C diff colitis  ? Requip [Ropinirole] Nausea Only  ? Shellfish Allergy   ? Codeine Nausea Only  ? ? ?Past Surgical History:  ?Procedure Laterality  Date  ? ANTERIOR CRUCIATE LIGAMENT REPAIR Left   ? LAPAROSCOPIC GASTRIC BANDING    ? LAPAROSCOPIC REPAIR AND REMOVAL OF GASTRIC BAND  01/2017  ? removed.  ? TENDON REPAIR Right   ? thumb  ? ? ?Social History  ? ?Tobacco Use  ? Smoking status: Every Day  ?  Packs/day: 0.50  ?  Years: 5.00  ?  Pack years: 2.50  ?  Types: Cigarettes  ? Smokeless tobacco: Never  ?Vaping Use  ? Vaping Use: Never used  ?Substance Use Topics  ? Alcohol use: Yes  ?  Alcohol/week: 0.0 standard drinks  ? Drug use: No  ? ? ? ?Medication list has been reviewed and updated. ? ?Current Meds  ?Medication Sig  ? benazepril-hydrochlorthiazide (LOTENSIN HCT) 20-25 MG tablet Take 1 tablet by  mouth daily.  ? dicyclomine (BENTYL) 20 MG tablet Take 1 tablet (20 mg total) by mouth 2 (two) times daily.  ? esomeprazole (NEXIUM) 40 MG capsule Take 1 capsule (40 mg total) by mouth daily.  ? loperamide (IMODIUM A-D) 2 MG tablet Take 1 tablet (2 mg total) by mouth 4 (four) times daily as needed for diarrhea or loose stools.  ? ofloxacin (FLOXIN OTIC) 0.3 % OTIC solution Place 5 drops into the left ear daily.  ? pramipexole (MIRAPEX) 1 MG tablet Take 4 tablets (4 mg total) by mouth at bedtime.  ? Saccharomyces boulardii (PROBIOTIC) 250 MG CAPS Take 250 mg by mouth 2 (two) times daily.  ? sildenafil (VIAGRA) 100 MG tablet Take 1 tablet (100 mg total) by mouth daily as needed for erectile dysfunction.  ? vancomycin (VANCOCIN) 125 MG capsule Take 1 capsule (125 mg total) by mouth 4 (four) times daily for 10 days.  ? ? ? ?  10/03/2021  ?  4:05 PM 08/24/2021  ? 11:11 AM 02/23/2021  ?  8:05 AM 01/22/2020  ?  9:32 AM  ?GAD 7 : Generalized Anxiety Score  ?Nervous, Anxious, on Edge 0 0 0 0  ?Control/stop worrying 0 0 0 0  ?Worry too much - different things 0 0 0 0  ?Trouble relaxing 0 $RemoveBe'2 1 1  'OghGwYMYS$ ?Restless 0 2 1 0  ?Easily annoyed or irritable 0 0 0 0  ?Afraid - awful might happen 0 0 0 0  ?Total GAD 7 Score 0 $Remov'4 2 1  'IENxnk$ ?Anxiety Difficulty Not difficult at all Not difficult at all  Not difficult at all  ? ? ? ?  10/03/2021  ?  4:04 PM  ?Depression screen PHQ 2/9  ?Decreased Interest 0  ?Down, Depressed, Hopeless 0  ?PHQ - 2 Score 0  ?Altered sleeping 0  ?Tired, decreased energy 0  ?Change in appetite 0  ?Feeling bad or failure about yourself  0  ?Trouble concentrating 0  ?Moving slowly or fidgety/restless 0  ?Suicidal thoughts 0  ?PHQ-9 Score 0  ?Difficult doing work/chores Not difficult at all  ? ? ?BP Readings from Last 3 Encounters:  ?11/03/21 134/76  ?10/15/21 131/86  ?10/03/21 138/88  ? ? ?Physical Exam ?Vitals and nursing note reviewed.  ?Constitutional:   ?   General: He is not in acute distress. ?   Appearance: He is  well-developed.  ?HENT:  ?   Head: Normocephalic and atraumatic.  ?   Right Ear: Tympanic membrane and ear canal normal.  ?   Left Ear: Decreased hearing noted.  ?   Ears:  ?   Comments: White exudate filling the ear canal - flushed with tepid  water/H2O2.  Large amount of rubbery white material removed.  Pt tolerated well and had improved hearing.  Scant amount of white exudate remains adherent to the canal.  TM slightly retracted - no effusion or erythema. ?Cardiovascular:  ?   Rate and Rhythm: Normal rate and regular rhythm.  ?Pulmonary:  ?   Effort: Pulmonary effort is normal. No respiratory distress.  ?   Breath sounds: No wheezing or rhonchi.  ?Abdominal:  ?   Tenderness: There is no abdominal tenderness. There is no guarding or rebound.  ?Musculoskeletal:  ?   Cervical back: Normal range of motion.  ?Lymphadenopathy:  ?   Cervical: No cervical adenopathy.  ?Skin: ?   General: Skin is warm and dry.  ?   Findings: No rash.  ?Neurological:  ?   Mental Status: He is alert and oriented to person, place, and time.  ?Psychiatric:     ?   Mood and Affect: Mood normal.     ?   Behavior: Behavior normal.  ? ? ?Wt Readings from Last 3 Encounters:  ?11/03/21 252 lb (114.3 kg)  ?10/03/21 264 lb (119.7 kg)  ?08/24/21 260 lb (117.9 kg)  ? ? ?BP 134/76   Pulse (!) 110   Temp 98.5 ?F (36.9 ?C) (Oral)   Ht $R'5\' 10"'sS$  (1.778 m)   Wt 252 lb (114.3 kg)   SpO2 96%   BMI 36.16 kg/m?  ? ?Assessment and Plan: ?1. Acute diffuse otitis externa of left ear ?Completed course of Augmentin and drops ?Exam is improving significantly - will continue drops now that canal is open for the next few days. ?Call if symptoms worsen ?- ofloxacin (FLOXIN OTIC) 0.3 % OTIC solution; Place 5 drops into the left ear daily.  Dispense: 5 mL; Refill: 0 ? ?2. C. difficile diarrhea ?Started oral Vancomycin yesterday. ?Add probiotic such as Kefir  ?Call if symptoms relapse after treatment complete ? ? ?Partially dictated using Editor, commissioning. Any errors are  unintentional. ? ?Halina Maidens, MD ?[facility] ?[facility] ? ?11/03/2021 ? ? ? ? ? ?

## 2022-02-24 ENCOUNTER — Ambulatory Visit (INDEPENDENT_AMBULATORY_CARE_PROVIDER_SITE_OTHER): Payer: PRIVATE HEALTH INSURANCE | Admitting: Internal Medicine

## 2022-02-24 ENCOUNTER — Encounter: Payer: Self-pay | Admitting: Internal Medicine

## 2022-02-24 VITALS — BP 138/88 | HR 78 | Ht 70.0 in | Wt 264.4 lb

## 2022-02-24 DIAGNOSIS — E669 Obesity, unspecified: Secondary | ICD-10-CM | POA: Diagnosis not present

## 2022-02-24 DIAGNOSIS — I10 Essential (primary) hypertension: Secondary | ICD-10-CM | POA: Diagnosis not present

## 2022-02-24 DIAGNOSIS — Z0000 Encounter for general adult medical examination without abnormal findings: Secondary | ICD-10-CM | POA: Diagnosis not present

## 2022-02-24 DIAGNOSIS — K219 Gastro-esophageal reflux disease without esophagitis: Secondary | ICD-10-CM | POA: Diagnosis not present

## 2022-02-24 DIAGNOSIS — N529 Male erectile dysfunction, unspecified: Secondary | ICD-10-CM

## 2022-02-24 DIAGNOSIS — Z131 Encounter for screening for diabetes mellitus: Secondary | ICD-10-CM | POA: Diagnosis not present

## 2022-02-24 DIAGNOSIS — G2581 Restless legs syndrome: Secondary | ICD-10-CM

## 2022-02-24 DIAGNOSIS — Z125 Encounter for screening for malignant neoplasm of prostate: Secondary | ICD-10-CM | POA: Diagnosis not present

## 2022-02-24 DIAGNOSIS — E782 Mixed hyperlipidemia: Secondary | ICD-10-CM

## 2022-02-24 MED ORDER — PHENTERMINE HCL 37.5 MG PO CAPS: 37.5000 mg | ORAL_CAPSULE | ORAL | 1 refills | Status: DC

## 2022-02-24 MED ORDER — SILDENAFIL CITRATE 100 MG PO TABS: 100.0000 mg | ORAL_TABLET | Freq: Every day | ORAL | 3 refills | Status: DC | PRN

## 2022-02-24 MED ORDER — ESOMEPRAZOLE MAGNESIUM 40 MG PO CPDR: 40.0000 mg | DELAYED_RELEASE_CAPSULE | Freq: Every day | ORAL | 3 refills | Status: DC

## 2022-02-24 MED ORDER — BENAZEPRIL-HYDROCHLOROTHIAZIDE 20-25 MG PO TABS
1.0000 | ORAL_TABLET | Freq: Every day | ORAL | 3 refills | Status: DC
Start: 2022-02-24 — End: 2023-02-28

## 2022-02-24 NOTE — Progress Notes (Signed)
Date:  02/24/2022   Name:  Kyle Ortega.   DOB:  06/13/1976   MRN:  [id number]   Chief Complaint: Annual Exam Kyle Ortega. is a 46 y.o. male who presents today for his Complete Annual Exam. He feels well. He reports exercising- none. He reports he is sleeping poorly.  Traveling RN doing IG infusions.  Colonoscopy: none  Immunization History  Administered Date(s) Administered   Influenza,inj,Quad PF,6+ Mos 03/31/2015, 04/04/2016, 04/22/2018   PFIZER(Purple Top)SARS-COV-2 Vaccination 08/18/2019, 09/04/2019   Tdap 03/31/2015, 09/26/2015   Health Maintenance Due  Topic Date Due   COLONOSCOPY (Pts 45-64yrs Insurance coverage will need to be confirmed)  Never done   COVID-19 Vaccine (3 - Pfizer series) 10/30/2019   INFLUENZA VACCINE  01/24/2022    Lab Results  Component Value Date   PSA1 1.5 01/22/2020   PSA1 1.8 07/16/2017    Hypertension This is a chronic problem. The problem is controlled (120-130/85 at home.). Pertinent negatives include no chest pain, headaches, palpitations or shortness of breath. Past treatments include ACE inhibitors and diuretics. The current treatment provides significant improvement. There is no history of kidney disease, CAD/MI or CVA.  Gastroesophageal Reflux He complains of heartburn. He reports no abdominal pain, no chest pain, no choking or no wheezing. This is a recurrent problem. The problem occurs occasionally. Pertinent negatives include no fatigue. He has tried a PPI for the symptoms.  RLS - doing well on Mirapex.  Not sleeping well for other reasons.  He does snore and may consider sleep testing. Obesity - hx of lap band with complications, tried Contrave in the past but knows insurance won't cover.     Lab Results  Component Value Date   NA 138 02/23/2021   K 4.3 02/23/2021   CO2 26 02/23/2021   GLUCOSE 101 (H) 02/23/2021   BUN 18 02/23/2021   CREATININE 0.89 02/23/2021   CALCIUM 10.3 (H) 02/23/2021   EGFR 106  02/23/2021   GFRNONAA 106 01/22/2020   Lab Results  Component Value Date   CHOL 187 02/23/2021   HDL 34 (L) 02/23/2021   LDLCALC 120 (H) 02/23/2021   TRIG 187 (H) 02/23/2021   CHOLHDL 5.5 (H) 02/23/2021   Lab Results  Component Value Date   TSH 0.753 09/22/2021   No results found for: "HGBA1C" Lab Results  Component Value Date   WBC 9.2 02/23/2021   HGB 17.2 02/23/2021   HCT 52.7 (H) 02/23/2021   MCV 82 02/23/2021   PLT 268 02/23/2021   Lab Results  Component Value Date   ALT 32 02/23/2021   AST 21 02/23/2021   ALKPHOS 100 02/23/2021   BILITOT 0.4 02/23/2021   Lab Results  Component Value Date   VD25OH 35.5 03/31/2015     Review of Systems  Constitutional:  Positive for unexpected weight change. Negative for appetite change, chills, diaphoresis and fatigue.  HENT:  Negative for hearing loss, tinnitus, trouble swallowing and voice change.   Eyes:  Negative for visual disturbance.  Respiratory:  Negative for choking, shortness of breath and wheezing.   Cardiovascular:  Negative for chest pain, palpitations and leg swelling.  Gastrointestinal:  Positive for heartburn. Negative for abdominal pain, blood in stool, constipation and diarrhea.  Genitourinary:  Negative for difficulty urinating, dysuria and frequency.  Musculoskeletal:  Negative for arthralgias, back pain and myalgias.  Skin:  Negative for color change and rash.  Neurological:  Negative for dizziness, syncope and headaches.  Hematological:  Negative  for adenopathy.  Psychiatric/Behavioral:  Positive for sleep disturbance. Negative for dysphoric mood. The patient is not nervous/anxious.     Patient Active Problem List   Diagnosis Date Noted   Sprain of medial collateral ligament of left knee 08/24/2021   Low TSH level 08/24/2021   Primary insomnia 01/21/2020   Allergy to galactose-alpha-1,3-galactose 04/22/2018   Polycythemia 08/31/2017   GERD without esophagitis 07/16/2017   Testicular hypofunction  06/29/2016   Complete tear of MCL of knee, right, sequela 04/03/2016   Overweight (BMI 25.0-29.9) 03/31/2015   Restless leg syndrome 03/31/2015   Essential hypertension 03/31/2015   Mixed hyperlipidemia 03/31/2015   History of nephrolithiasis 03/31/2015    Allergies  Allergen Reactions   Alpha-Gal Anaphylaxis    Can not eat red meat    Augmentin [Amoxicillin-Pot Clavulanate] Diarrhea    C diff colitis   Iodine Other (See Comments)   Requip [Ropinirole] Nausea Only   Shellfish Allergy    Codeine Nausea Only    Past Surgical History:  Procedure Laterality Date   ANTERIOR CRUCIATE LIGAMENT REPAIR Left    LAPAROSCOPIC GASTRIC BANDING     LAPAROSCOPIC REPAIR AND REMOVAL OF GASTRIC BAND  01/2017   removed.   TENDON REPAIR Right    thumb    Social History   Tobacco Use   Smoking status: Some Days    Packs/day: 0.25    Years: 5.00    Total pack years: 1.25    Types: Cigarettes   Smokeless tobacco: Never  Vaping Use   Vaping Use: Never used  Substance Use Topics   Alcohol use: Yes    Alcohol/week: 0.0 standard drinks of alcohol   Drug use: No     Medication list has been reviewed and updated.  Current Meds  Medication Sig   benazepril-hydrochlorthiazide (LOTENSIN HCT) 20-25 MG tablet Take 1 tablet by mouth daily.   esomeprazole (NEXIUM) 40 MG capsule Take 1 capsule (40 mg total) by mouth daily.   loperamide (IMODIUM A-D) 2 MG tablet Take 1 tablet (2 mg total) by mouth 4 (four) times daily as needed for diarrhea or loose stools.   pramipexole (MIRAPEX) 1 MG tablet Take 4 tablets (4 mg total) by mouth at bedtime.   Saccharomyces boulardii (PROBIOTIC) 250 MG CAPS Take 250 mg by mouth 2 (two) times daily.   sildenafil (VIAGRA) 100 MG tablet Take 1 tablet (100 mg total) by mouth daily as needed for erectile dysfunction.       02/24/2022    9:16 AM 11/03/2021    8:06 AM 10/03/2021    4:05 PM 08/24/2021   11:11 AM  GAD 7 : Generalized Anxiety Score  Nervous, Anxious, on  Edge 0 0 0 0  Control/stop worrying 0 0 0 0  Worry too much - different things 0 0 0 0  Trouble relaxing 1 0 0 2  Restless 0 0 0 2  Easily annoyed or irritable 0 0 0 0  Afraid - awful might happen 0 0 0 0  Total GAD 7 Score 1 0 0 4  Anxiety Difficulty Not difficult at all Not difficult at all Not difficult at all Not difficult at all       02/24/2022    9:16 AM 11/03/2021    8:06 AM 10/03/2021    4:04 PM  Depression screen PHQ 2/9  Decreased Interest 0 0 0  Down, Depressed, Hopeless 0 0 0  PHQ - 2 Score 0 0 0  Altered sleeping 1 0 0  Tired, decreased energy 1 3 0  Change in appetite 1 2 0  Feeling bad or failure about yourself  0 0 0  Trouble concentrating 0 0 0  Moving slowly or fidgety/restless 0 0 0  Suicidal thoughts 0 0 0  PHQ-9 Score 3 5 0  Difficult doing work/chores Not difficult at all Not difficult at all Not difficult at all    BP Readings from Last 3 Encounters:  02/24/22 138/88  11/03/21 134/76  10/15/21 131/86    Physical Exam Vitals and nursing note reviewed.  Constitutional:      Appearance: Normal appearance. He is well-developed.  HENT:     Head: Normocephalic.     Right Ear: Tympanic membrane, ear canal and external ear normal.     Left Ear: Tympanic membrane, ear canal and external ear normal.     Nose: Nose normal.  Eyes:     Conjunctiva/sclera: Conjunctivae normal.     Pupils: Pupils are equal, round, and reactive to light.  Neck:     Thyroid: No thyromegaly.     Vascular: No carotid bruit.  Cardiovascular:     Rate and Rhythm: Normal rate and regular rhythm.     Heart sounds: Normal heart sounds.  Pulmonary:     Effort: Pulmonary effort is normal.     Breath sounds: Normal breath sounds. No wheezing.  Chest:  Breasts:    Right: No mass.     Left: No mass.  Abdominal:     General: Bowel sounds are normal.     Palpations: Abdomen is soft.     Tenderness: There is no abdominal tenderness.  Musculoskeletal:        General: Normal range  of motion.     Cervical back: Normal range of motion and neck supple.     Right lower leg: No edema.     Left lower leg: No edema.  Lymphadenopathy:     Cervical: No cervical adenopathy.  Skin:    General: Skin is warm and dry.  Neurological:     Mental Status: He is alert and oriented to person, place, and time.     Deep Tendon Reflexes: Reflexes are normal and symmetric.  Psychiatric:        Attention and Perception: Attention normal.        Mood and Affect: Mood normal.        Thought Content: Thought content normal.     Wt Readings from Last 3 Encounters:  02/24/22 264 lb 6.4 oz (119.9 kg)  11/03/21 252 lb (114.3 kg)  10/03/21 264 lb (119.7 kg)    BP 138/88 (BP Location: Right Arm, Cuff Size: Large)   Pulse 78   Ht $R'5\' 10"'Gi$  (1.778 m)   Wt 264 lb 6.4 oz (119.9 kg)   SpO2 95%   BMI 37.94 kg/m   Assessment and Plan: 1. Annual physical exam Exam is normal except for weight. Encourage regular exercise and appropriate dietary changes. Will try Phentermine. Will discuss sleep study at follow up. - CBC with Differential/Platelet - Comprehensive metabolic panel - Hemoglobin A1c - Lipid panel - PSA - Urinalysis, Routine w reflex microscopic - Iron, TIBC and Ferritin Panel - Magnesium  2. Essential hypertension Clinically stable exam with fairly well controlled BP. Tolerating medications without side effects at this time. Pt to continue current regimen and low sodium diet; benefits of regular exercise as able discussed. - CBC with Differential/Platelet - Comprehensive metabolic panel - Urinalysis, Routine w reflex microscopic - benazepril-hydrochlorthiazide (  LOTENSIN HCT) 20-25 MG tablet; Take 1 tablet by mouth daily.  Dispense: 90 tablet; Refill: 3  3. GERD without esophagitis Symptoms well controlled on daily PPI No red flag signs such as weight loss, n/v, melena Will continue Nexium. - CBC with Differential/Platelet - esomeprazole (NEXIUM) 40 MG capsule; Take 1  capsule (40 mg total) by mouth daily.  Dispense: 90 capsule; Refill: 3  4. Mixed hyperlipidemia - Lipid panel  5. Screening for diabetes mellitus - Hemoglobin A1c  6. Prostate cancer screening DRE deferred - PSA  7. Restless leg syndrome Continue Mirapex; will also check iron and magnesium - Iron, TIBC and Ferritin Panel - Magnesium  8. Erectile dysfunction, unspecified erectile dysfunction type - sildenafil (VIAGRA) 100 MG tablet; Take 1 tablet (100 mg total) by mouth daily as needed for erectile dysfunction.  Dispense: 20 tablet; Refill: 3  9. Obesity, Class II, BMI 35-39.9 Start phentermine Work on diet changes, exercise Follow up in 6 weeks. - phentermine 37.5 MG capsule; Take 1 capsule (37.5 mg total) by mouth every morning.  Dispense: 30 capsule; Refill: 1   Partially dictated using Editor, commissioning. Any errors are unintentional.  Halina Maidens, MD [facility]  02/24/2022

## 2022-03-14 ENCOUNTER — Ambulatory Visit: Payer: PRIVATE HEALTH INSURANCE | Admitting: Internal Medicine

## 2022-03-24 ENCOUNTER — Telehealth: Payer: Self-pay

## 2022-03-24 NOTE — Telephone Encounter (Signed)
Called pt as a reminder to get labs done. Pt verbalized understanding.   KP 

## 2022-03-29 ENCOUNTER — Other Ambulatory Visit: Payer: Self-pay | Admitting: Internal Medicine

## 2022-03-29 DIAGNOSIS — G2581 Restless legs syndrome: Secondary | ICD-10-CM

## 2022-03-29 DIAGNOSIS — N529 Male erectile dysfunction, unspecified: Secondary | ICD-10-CM

## 2022-03-29 DIAGNOSIS — K219 Gastro-esophageal reflux disease without esophagitis: Secondary | ICD-10-CM

## 2022-03-29 NOTE — Telephone Encounter (Signed)
Requested medication (s) are due for refill today:   Yes  Requested medication (s) are on the active medication list:   Yes for all 3  Future visit scheduled:   Yes 04/07/2022   Last ordered: Nexium 02/24/2022 #90, 3;  Mirapex 08/24/2021 #360, 3 refills;  Viagra 02/24/2022 #20, 3 refills  Returned because AST and ALT due.   Looks like he has not had labs drawn that were ordered on 02/24/2022.     Requested Prescriptions  Pending Prescriptions Disp Refills   esomeprazole (NEXIUM) 40 MG capsule [Pharmacy Med Name: ESOMEPRAZOLE MAG DR 40 MG CAP] 90 capsule 3    Sig: TAKE ONE CAPSULE BY MOUTH DAILY     Gastroenterology: Proton Pump Inhibitors 2 Failed - 03/29/2022  9:37 AM      Failed - ALT in normal range and within 360 days    ALT  Date Value Ref Range Status  02/23/2021 32 0 - 44 IU/L Final         Failed - AST in normal range and within 360 days    AST  Date Value Ref Range Status  02/23/2021 21 0 - 40 IU/L Final         Passed - Valid encounter within last 12 months    Recent Outpatient Visits           1 month ago Annual physical exam   [place] [facility], Jesse Sans, MD   4 months ago Acute diffuse otitis externa of left ear   [place] [facility] at [facility], Jesse Sans, MD   5 months ago Acute diffuse otitis externa of left ear   [place] [facility] at [facility], Jesse Sans, MD   7 months ago Essential hypertension   [place] [facility] at [facility], Jesse Sans, MD   1 year ago Annual physical exam   [facility] Primary [facility] and [facility] Medicine at [facility], Jesse Sans, MD       Future Appointments             In 1 week Army Melia Jesse Sans, MD [facility] at [facility], [facility]   In 11 months Glean Hess, MD [facility] Primary Care and Sports Medicine at  [facility] [place]             pramipexole (MIRAPEX) 1 MG tablet [Pharmacy Med Name: PRAMIPEXOLE 1 MG TABLET] 270 tablet     Sig: TAKE 2-3 TABLETS BY MOUTH AT BEDTIME     Neurology:  Parkinsonian Agents Passed - 03/29/2022  9:37 AM      Passed - Last BP in normal range    BP Readings from Last 1 Encounters:  02/24/22 138/88         Passed - Last Heart Rate in normal range    Pulse Readings from Last 1 Encounters:  02/24/22 78         Passed - Valid encounter within last 12 months    Recent Outpatient Visits           1 month ago Annual physical exam   [place] [facility] at [facility], Jesse Sans, MD   4 months ago Acute diffuse otitis externa of left ear   [facility] at [facility],  Jesse Sans, MD   5 months ago Acute diffuse otitis externa of left ear   [place] [facility] at [facility], Jesse Sans, MD   7 months ago Essential hypertension   [place] [facility] and [facility] Medicine at [facility], Jesse Sans, MD   1 year ago Annual physical exam   [place] [facility] and [facility] Medicine at [facility], Jesse Sans, MD       Future Appointments             In 1 week Glean Hess, MD [facility] Primary Care and Sports Medicine at [facility], [facility]   In 11 months Glean Hess, MD [facility] Primary Care and Sports Medicine at [facility] [place]             sildenafil (VIAGRA) 100 MG tablet [Pharmacy Med Name: SILDENAFIL 100 MG TABLET] 20 tablet 3    Sig: TAKE ONE TABLET BY MOUTH DAILY AS NEEDED FOR ERECTILE DYSFUNCTION     Urology: Erectile Dysfunction Agents Failed - 03/29/2022  9:37 AM      Failed - AST in normal range and within 360 days    AST  Date Value Ref Range Status  02/23/2021 21 0 - 40 IU/L Final         Failed - ALT in normal range and within 360 days    ALT  Date Value Ref  Range Status  02/23/2021 32 0 - 44 IU/L Final         Passed - Last BP in normal range    BP Readings from Last 1 Encounters:  02/24/22 138/88         Passed - Valid encounter within last 12 months    Recent Outpatient Visits           1 month ago Annual physical exam   [place] [facility] at [facility], Jesse Sans, MD   4 months ago Acute diffuse otitis externa of left ear   [place] [facility] Medicine at [facility], Jesse Sans, MD   5 months ago Acute diffuse otitis externa of left ear   [place] [facility] and [facility] Medicine at [facility], Jesse Sans, MD   7 months ago Essential hypertension   [place] [facility] at [facility], Jesse Sans, MD   1 year ago Annual physical exam   [facility] Primary Care and [facility] at [facility], Jesse Sans, MD       Future Appointments             In 1 week Army Melia, Jesse Sans, MD [facility] Primary Care and Sports Medicine at [facility], [facility]   In 11 months Army Melia, Jesse Sans, MD [place] Primary Care and Sports Medicine at [facility], [facility]

## 2022-03-31 LAB — COMPREHENSIVE METABOLIC PANEL
ALT: 22 IU/L (ref 0–44)
AST: 19 IU/L (ref 0–40)
Albumin/Globulin Ratio: 2.1 (ref 1.2–2.2)
Albumin: 4.8 g/dL (ref 4.1–5.1)
Alkaline Phosphatase: 95 IU/L (ref 44–121)
BUN/Creatinine Ratio: 20 (ref 9–20)
BUN: 16 mg/dL (ref 6–24)
Bilirubin Total: 0.3 mg/dL (ref 0.0–1.2)
CO2: 23 mmol/L (ref 20–29)
Calcium: 10 mg/dL (ref 8.7–10.2)
Chloride: 103 mmol/L (ref 96–106)
Creatinine, Ser: 0.79 mg/dL (ref 0.76–1.27)
Globulin, Total: 2.3 g/dL (ref 1.5–4.5)
Glucose: 100 mg/dL — ABNORMAL HIGH (ref 70–99)
Potassium: 4.9 mmol/L (ref 3.5–5.2)
Sodium: 142 mmol/L (ref 134–144)
Total Protein: 7.1 g/dL (ref 6.0–8.5)
eGFR: 110 mL/min/{1.73_m2} (ref >59)

## 2022-03-31 LAB — CBC WITH DIFFERENTIAL/PLATELET
Basophils Absolute: 0.1 10*3/uL (ref 0.0–0.2)
Basos: 1 %
EOS (ABSOLUTE): 0.2 10*3/uL (ref 0.0–0.4)
Eos: 2 %
Hematocrit: 54 % — ABNORMAL HIGH (ref 37.5–51.0)
Hemoglobin: 17.8 g/dL — ABNORMAL HIGH (ref 13.0–17.7)
Immature Grans (Abs): 0.1 10*3/uL (ref 0.0–0.1)
Immature Granulocytes: 1 %
Lymphocytes Absolute: 3.7 10*3/uL — ABNORMAL HIGH (ref 0.7–3.1)
Lymphs: 36 %
MCH: 26.8 pg (ref 26.6–33.0)
MCHC: 33 g/dL (ref 31.5–35.7)
MCV: 81 fL (ref 79–97)
Monocytes Absolute: 0.6 10*3/uL (ref 0.1–0.9)
Monocytes: 6 %
Neutrophils Absolute: 5.6 10*3/uL (ref 1.4–7.0)
Neutrophils: 54 %
Platelets: 266 10*3/uL (ref 150–450)
RBC: 6.63 x10E6/uL — ABNORMAL HIGH (ref 4.14–5.80)
RDW: 14.2 % (ref 11.6–15.4)
WBC: 10.1 10*3/uL (ref 3.4–10.8)

## 2022-03-31 LAB — URINALYSIS, ROUTINE W REFLEX MICROSCOPIC
Bilirubin, UA: NEGATIVE
Glucose, UA: NEGATIVE
Ketones, UA: NEGATIVE
Leukocytes,UA: NEGATIVE
Nitrite, UA: NEGATIVE
Protein,UA: NEGATIVE
RBC, UA: NEGATIVE
Specific Gravity, UA: >=1.03 — AB (ref 1.005–1.030)
Urobilinogen, Ur: 0.2 mg/dL (ref 0.2–1.0)
pH, UA: 6 (ref 5.0–7.5)

## 2022-03-31 LAB — IRON,TIBC AND FERRITIN PANEL
Ferritin: 99 ng/mL (ref 30–400)
Iron Saturation: 26 % (ref 15–55)
Iron: 97 ug/dL (ref 38–169)
Total Iron Binding Capacity: 368 ug/dL (ref 250–450)
UIBC: 271 ug/dL (ref 111–343)

## 2022-03-31 LAB — MAGNESIUM: Magnesium: 2.2 mg/dL (ref 1.6–2.3)

## 2022-03-31 LAB — LIPID PANEL
Chol/HDL Ratio: 6.4 ratio — ABNORMAL HIGH (ref 0.0–5.0)
Cholesterol, Total: 224 mg/dL — ABNORMAL HIGH (ref 100–199)
HDL: 35 mg/dL — ABNORMAL LOW (ref >39)
LDL Chol Calc (NIH): 158 mg/dL — ABNORMAL HIGH (ref 0–99)
Triglycerides: 168 mg/dL — ABNORMAL HIGH (ref 0–149)
VLDL Cholesterol Cal: 31 mg/dL (ref 5–40)

## 2022-03-31 LAB — PSA: Prostate Specific Ag, Serum: 2.9 ng/mL (ref 0.0–4.0)

## 2022-03-31 LAB — HEMOGLOBIN A1C
Est. average glucose Bld gHb Est-mCnc: 126 mg/dL
Hgb A1c MFr Bld: 6 % — ABNORMAL HIGH (ref 4.8–5.6)

## 2022-04-05 ENCOUNTER — Other Ambulatory Visit: Payer: Self-pay | Admitting: Internal Medicine

## 2022-04-05 DIAGNOSIS — K219 Gastro-esophageal reflux disease without esophagitis: Secondary | ICD-10-CM

## 2022-04-06 ENCOUNTER — Other Ambulatory Visit: Payer: Self-pay | Admitting: Internal Medicine

## 2022-04-06 DIAGNOSIS — G2581 Restless legs syndrome: Secondary | ICD-10-CM

## 2022-04-06 DIAGNOSIS — N529 Male erectile dysfunction, unspecified: Secondary | ICD-10-CM

## 2022-04-06 NOTE — Telephone Encounter (Signed)
Unable to refill per protocol, last refill by provider 03/29/22 for 90 and 1.E-Prescribing Status: Receipt confirmed by pharmacy (03/29/2022 10:48 AM EDT). Will refuse.   Requested Prescriptions  Pending Prescriptions Disp Refills  . esomeprazole (NEXIUM) 40 MG capsule [Pharmacy Med Name: ESOMEPRAZOLE MAG DR 40 MG CAP] 90 capsule 1    Sig: TAKE ONE CAPSULE BY MOUTH DAILY     Gastroenterology: Proton Pump Inhibitors 2 Passed - 04/05/2022  6:49 PM      Passed - ALT in normal range and within 360 days    ALT  Date Value Ref Range Status  03/30/2022 22 0 - 44 IU/L Final         Passed - AST in normal range and within 360 days    AST  Date Value Ref Range Status  03/30/2022 19 0 - 40 IU/L Final         Passed - Valid encounter within last 12 months    Recent Outpatient Visits          1 month ago Annual physical exam   [place] [facility] at [facility], Jesse Sans, MD   5 months ago Acute diffuse otitis externa of left ear   [place] [facility] at [facility], Jesse Sans, MD   6 months ago Acute diffuse otitis externa of left ear   [place] [facility] Sports Medicine at [facility], Jesse Sans, MD   7 months ago Essential hypertension   [place] Primary Care and Sports Medicine at [facility], Jesse Sans, MD   1 year ago Annual physical exam   [facility] Primary Care and Sports Medicine at [facility], Jesse Sans, MD      Future Appointments            Tomorrow Army Melia Jesse Sans, MD [facility] Primary Care and Sports Medicine at [facility], [facility]   In 11 months Army Melia, Jesse Sans, MD [facility] Health Primary Care and Sports Medicine at [facility], [facility]

## 2022-04-06 NOTE — Telephone Encounter (Signed)
Unable to refill per protocol, last refill by  Provider 03/29/22.E-Prescribing Status: Receipt confirmed by pharmacy (03/29/2022 10:48 AM EDT). Will refuse duplicate request.   Requested Prescriptions  Pending Prescriptions Disp Refills  . sildenafil (VIAGRA) 100 MG tablet [Pharmacy Med Name: SILDENAFIL 100 MG TABLET] 20 tablet 1    Sig: TAKE ONE TABLET BY MOUTH DAILY AS NEEDED FOR ERECTILE DYSFUNCTION     Urology: Erectile Dysfunction Agents Passed - 04/06/2022  7:57 AM      Passed - AST in normal range and within 360 days    AST  Date Value Ref Range Status  03/30/2022 19 0 - 40 IU/L Final         Passed - ALT in normal range and within 360 days    ALT  Date Value Ref Range Status  03/30/2022 22 0 - 44 IU/L Final         Passed - Last BP in normal range    BP Readings from Last 1 Encounters:  02/24/22 138/88         Passed - Valid encounter within last 12 months    Recent Outpatient Visits          1 month ago Annual physical exam   [place] [facility] at [facility], Jesse Sans, MD   5 months ago Acute diffuse otitis externa of left ear   [place] [facility] at [facility], Jesse Sans, MD   6 months ago Acute diffuse otitis externa of left ear   [place] [facility] and Sports Medicine at [facility], Jesse Sans, MD   7 months ago Essential hypertension   [place] [facility] at [facility], Jesse Sans, MD   1 year ago Annual physical exam   [facility] Primary Care and Sports Medicine at [facility], Jesse Sans, MD      Future Appointments            Tomorrow Army Melia Jesse Sans, MD [facility] Primary Care and Sports Medicine at [facility], [facility]   In 11 months Army Melia, Jesse Sans, MD [facility] Primary Care and Sports Medicine at [facility], [facility]           . pramipexole (MIRAPEX) 1 MG tablet [Pharmacy Med Name: PRAMIPEXOLE 1  MG TABLET] 270 tablet 1    Sig: TAKE 2-3 TABLETS BY MOUTH AT BEDTIME     Neurology:  Parkinsonian Agents Passed - 04/06/2022  7:57 AM      Passed - Last BP in normal range    BP Readings from Last 1 Encounters:  02/24/22 138/88         Passed - Last Heart Rate in normal range    Pulse Readings from Last 1 Encounters:  02/24/22 78         Passed - Valid encounter within last 12 months    Recent Outpatient Visits          1 month ago Annual physical exam    Primary Care and Sports Medicine at [facility], Jesse Sans, MD   5 months ago Acute diffuse otitis externa of left ear    Primary Care and Sports Medicine at [facility], Jesse Sans, MD   6 months ago Acute diffuse otitis externa of left ear    Primary Care and Sports Medicine at [facility], Jesse Sans, MD  7 months ago Essential hypertension   [place] [facility] Care and [facility] Medicine at [facility], Jesse Sans, MD   1 year ago Annual physical exam   [facility] and [facility] at [facility], Jesse Sans, MD      Future Appointments            Tomorrow Army Melia Jesse Sans, MD [facility] Primary Care and Sports Medicine at [facility], [facility]   In 11 months Army Melia, Jesse Sans, MD [facility] Primary Care and Sports Medicine at [facility], [facility]

## 2022-04-07 ENCOUNTER — Encounter: Payer: Self-pay | Admitting: Internal Medicine

## 2022-04-07 ENCOUNTER — Ambulatory Visit (INDEPENDENT_AMBULATORY_CARE_PROVIDER_SITE_OTHER): Payer: PRIVATE HEALTH INSURANCE | Admitting: Internal Medicine

## 2022-04-07 VITALS — BP 112/78 | HR 95 | Ht 70.0 in | Wt 254.6 lb

## 2022-04-07 DIAGNOSIS — E669 Obesity, unspecified: Secondary | ICD-10-CM

## 2022-04-07 DIAGNOSIS — I10 Essential (primary) hypertension: Secondary | ICD-10-CM

## 2022-04-07 DIAGNOSIS — F5101 Primary insomnia: Secondary | ICD-10-CM

## 2022-04-07 NOTE — Progress Notes (Signed)
Date:  04/07/2022   Name:  Kyle Ortega.   DOB:  07-29-1975   MRN:  [id number]   Chief Complaint: Weight Check and Hypertension Weight management - pt is here for weight management follow up.  Started on phentermine on 02/24/22.  Doing well without side effects.  Starting weight 264 lbs.   Today's weight 254.6 lbs.  Current diet is portion control and current exercise routine is walking 1-2 days weekly.  Hypertension This is a chronic problem. The problem is controlled. Pertinent negatives include no chest pain, headaches or shortness of breath. Past treatments include ACE inhibitors and diuretics. The current treatment provides significant improvement.   Insomnia/RLS - due to restless leg symptoms but these are fairly well controlled on Mirapex.   Possible OSA - he has an Epworth score of 12 with high chance of dozing in several situations.     Lab Results  Component Value Date   NA 142 03/30/2022   K 4.9 03/30/2022   CO2 23 03/30/2022   GLUCOSE 100 (H) 03/30/2022   BUN 16 03/30/2022   CREATININE 0.79 03/30/2022   CALCIUM 10.0 03/30/2022   EGFR 110 03/30/2022   GFRNONAA 106 01/22/2020   Lab Results  Component Value Date   CHOL 224 (H) 03/30/2022   HDL 35 (L) 03/30/2022   LDLCALC 158 (H) 03/30/2022   TRIG 168 (H) 03/30/2022   CHOLHDL 6.4 (H) 03/30/2022   Lab Results  Component Value Date   TSH 0.753 09/22/2021   Lab Results  Component Value Date   HGBA1C 6.0 (H) 03/30/2022   Lab Results  Component Value Date   WBC 10.1 03/30/2022   HGB 17.8 (H) 03/30/2022   HCT 54.0 (H) 03/30/2022   MCV 81 03/30/2022   PLT 266 03/30/2022   Lab Results  Component Value Date   ALT 22 03/30/2022   AST 19 03/30/2022   ALKPHOS 95 03/30/2022   BILITOT 0.3 03/30/2022   Lab Results  Component Value Date   VD25OH 35.5 03/31/2015     Review of Systems  Constitutional:  Negative for chills, diaphoresis, fatigue and unexpected weight change.  Respiratory:  Negative for  chest tightness and shortness of breath.   Cardiovascular:  Negative for chest pain and leg swelling.  Neurological:  Negative for dizziness, light-headedness and headaches.  Psychiatric/Behavioral:  Positive for sleep disturbance. Negative for dysphoric mood. The patient is not nervous/anxious.     Patient Active Problem List   Diagnosis Date Noted   Sprain of medial collateral ligament of left knee 08/24/2021   Low TSH level 08/24/2021   Primary insomnia 01/21/2020   Allergy to alpha-gal 04/22/2018   Polycythemia 08/31/2017   GERD without esophagitis 07/16/2017   Testicular hypofunction 06/29/2016   Complete tear of MCL of knee, right, sequela 04/03/2016   Obesity, Class II, BMI 35-39.9 03/31/2015   Restless leg syndrome 03/31/2015   Essential hypertension 03/31/2015   Mixed hyperlipidemia 03/31/2015   History of nephrolithiasis 03/31/2015    Allergies  Allergen Reactions   Alpha-Gal Anaphylaxis    Can not eat red meat    Augmentin [Amoxicillin-Pot Clavulanate] Diarrhea    C diff colitis   Iodine Other (See Comments)   Requip [Ropinirole] Nausea Only   Shellfish Allergy    Codeine Nausea Only    Past Surgical History:  Procedure Laterality Date   ANTERIOR CRUCIATE LIGAMENT REPAIR Left    LAPAROSCOPIC GASTRIC BANDING     LAPAROSCOPIC REPAIR AND REMOVAL OF GASTRIC  BAND  01/2017   removed.   TENDON REPAIR Right    thumb    Social History   Tobacco Use   Smoking status: Some Days    Packs/day: 0.10    Years: 5.00    Total pack years: 0.50    Types: Cigarettes   Smokeless tobacco: Never  Vaping Use   Vaping Use: Never used  Substance Use Topics   Alcohol use: Yes    Alcohol/week: 0.0 standard drinks of alcohol   Drug use: No     Medication list has been reviewed and updated.  Current Meds  Medication Sig   benazepril-hydrochlorthiazide (LOTENSIN HCT) 20-25 MG tablet Take 1 tablet by mouth daily.   esomeprazole (NEXIUM) 40 MG capsule TAKE ONE CAPSULE BY  MOUTH DAILY   phentermine 37.5 MG capsule Take 1 capsule (37.5 mg total) by mouth every morning.   pramipexole (MIRAPEX) 1 MG tablet TAKE 2-3 TABLETS BY MOUTH AT BEDTIME   Saccharomyces boulardii (PROBIOTIC) 250 MG CAPS Take 250 mg by mouth 2 (two) times daily.   sildenafil (VIAGRA) 100 MG tablet TAKE ONE TABLET BY MOUTH DAILY AS NEEDED FOR ERECTILE DYSFUNCTION       04/07/2022    2:41 PM 02/24/2022    9:16 AM 11/03/2021    8:06 AM 10/03/2021    4:05 PM  GAD 7 : Generalized Anxiety Score  Nervous, Anxious, on Edge 0 0 0 0  Control/stop worrying 0 0 0 0  Worry too much - different things 0 0 0 0  Trouble relaxing 1 1 0 0  Restless 1 0 0 0  Easily annoyed or irritable 0 0 0 0  Afraid - awful might happen 0 0 0 0  Total GAD 7 Score 2 1 0 0  Anxiety Difficulty Not difficult at all Not difficult at all Not difficult at all Not difficult at all       04/07/2022    2:41 PM 02/24/2022    9:16 AM 11/03/2021    8:06 AM  Depression screen PHQ 2/9  Decreased Interest 1 0 0  Down, Depressed, Hopeless 0 0 0  PHQ - 2 Score 1 0 0  Altered sleeping 2 1 0  Tired, decreased energy _0 Change in appetite 0 1 2  Feeling bad or failure about yourself  0 0 0  Trouble concentrating 0 0 0  Moving slowly or fidgety/restless 0 0 0  Suicidal thoughts 0 0 0  PHQ-9 Score _1 Difficult doing work/chores Not difficult at all Not difficult at all Not difficult at all      04/07/2022    2:42 PM  Results of the Epworth flowsheet  Sitting and reading 3  Watching TV 0  Sitting, inactive in a public place (e.g. a theatre or a meeting) 3  As a passenger in a car for an hour without a break 0  Lying down to rest in the afternoon when circumstances permit 3  Sitting and talking to someone 0  Sitting quietly after a lunch without alcohol 3  In a car, while stopped for a few minutes in traffic 0  Total score 12    BP Readings from Last 3 Encounters:  04/07/22 112/78  02/24/22 138/88  11/03/21  134/76    Physical Exam Vitals and nursing note reviewed.  Constitutional:      General: He is not in acute distress.    Appearance: Normal appearance. He is well-developed.  HENT:  Head: Normocephalic and atraumatic.  Cardiovascular:     Rate and Rhythm: Normal rate and regular rhythm.  Pulmonary:     Effort: Pulmonary effort is normal. No respiratory distress.     Breath sounds: No wheezing or rhonchi.  Musculoskeletal:     Cervical back: Normal range of motion.     Right lower leg: No edema.     Left lower leg: No edema.  Skin:    General: Skin is warm and dry.     Capillary Refill: Capillary refill takes less than 2 seconds.     Findings: No rash.  Neurological:     Mental Status: He is alert and oriented to person, place, and time.  Psychiatric:        Mood and Affect: Mood normal.        Behavior: Behavior normal.     Wt Readings from Last 3 Encounters:  04/07/22 254 lb 9.6 oz (115.5 kg)  02/24/22 264 lb 6.4 oz (119.9 kg)  11/03/21 252 lb (114.3 kg)    BP 112/78   Pulse 95   Ht _0  (1.778 m)   Wt 254 lb 9.6 oz (115.5 kg)   SpO2 95%   BMI 36.53 kg/m   Assessment and Plan: 1. Essential hypertension Clinically stable exam with well controlled BP. Tolerating medications without side effects at this time. Pt to continue current regimen and low sodium diet; benefits of regular exercise as able discussed.  2. Obesity, Class II, BMI 35-39.9 Nice response to phentermine - 10 lb in one month. Will continue current regimen - call for phentermine + 1RF when needed and follow up in 3 months. Discussed OSA and sleep study - will consider ordering at next visit  3. Primary insomnia Continue Mirapex   Partially dictated using Editor, commissioning. Any errors are unintentional.  Halina Maidens, MD [facility]  04/07/2022

## 2022-04-08 IMAGING — CT CT SHOULDER*R* W/CM
1 series · 9 of 14 positions shown, 12 images · IV contrast (agent unspecified)
Comparison: None.

CONTRAST:  75mL OMNIPAQUE IOHEXOL 300 MG/ML  SOLN

CLINICAL DATA: Enlarging soft tissue mass superior to the right
shoulder. No history of malignancy.

EXAM:
CT OF THE UPPER RIGHT EXTREMITY WITH CONTRAST
TECHNIQUE: Multidetector CT imaging of the right shoulder was performed
according to the standard protocol following intravenous contrast
administration.

[Series 10: ax st · axial · 0.39mm/px · z∈[-619,-425]mm · 9 of 120 slices shown, 12 images]
[im 10/120  soft-tissue]
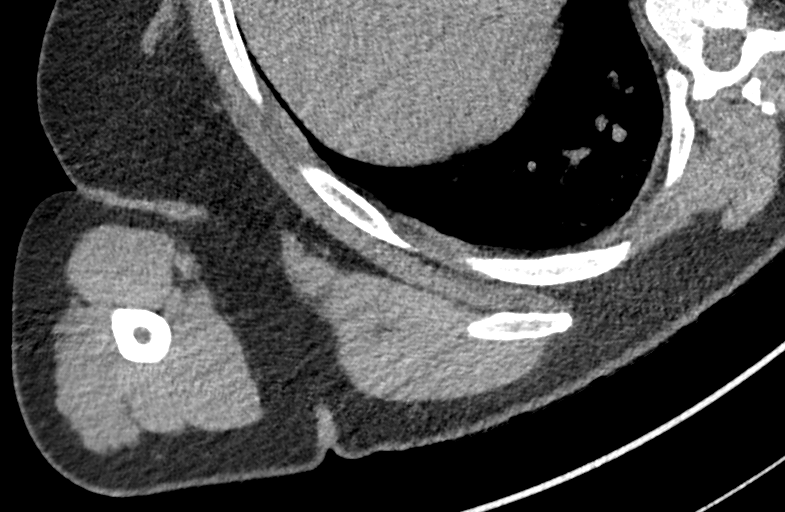
[im 10/120  bone]
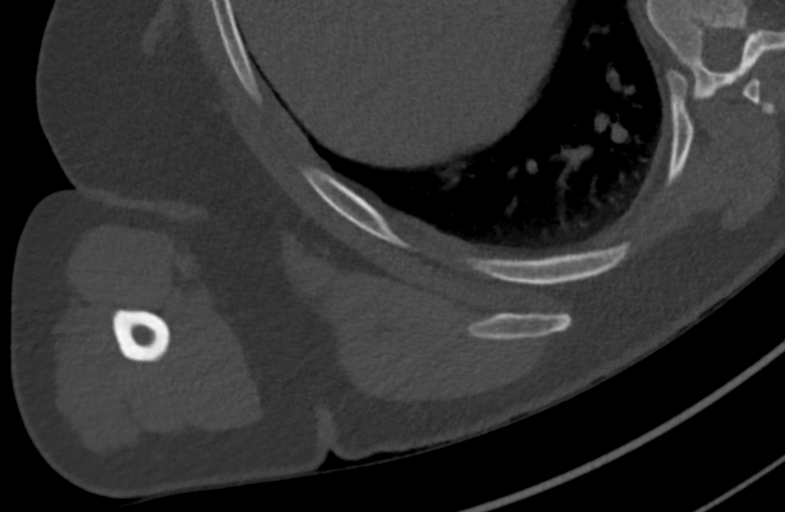
[im 28/120  bone]
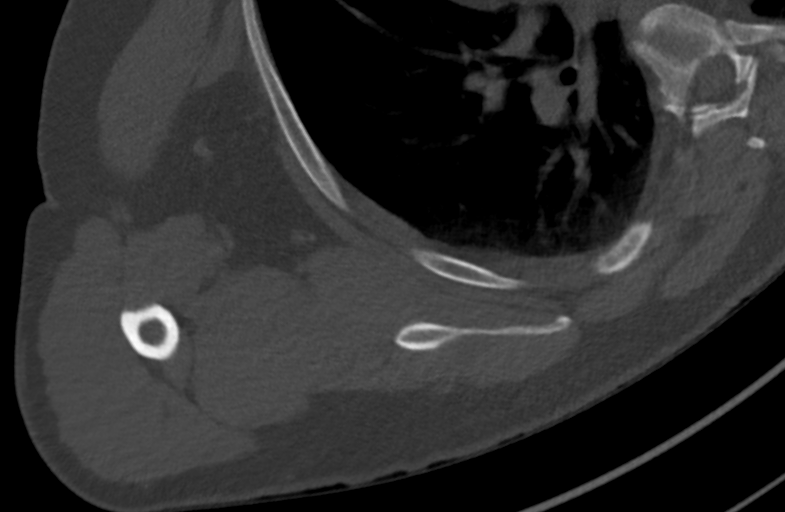
[im 37/120  bone]
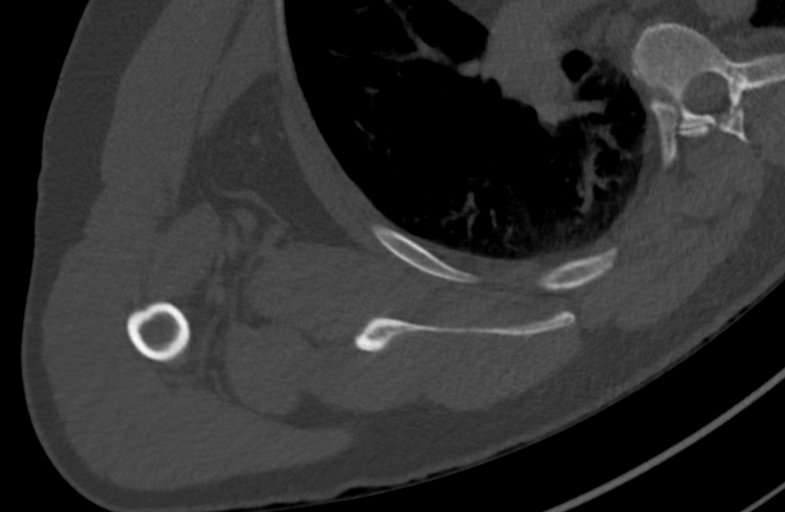
[im 46/120  bone]
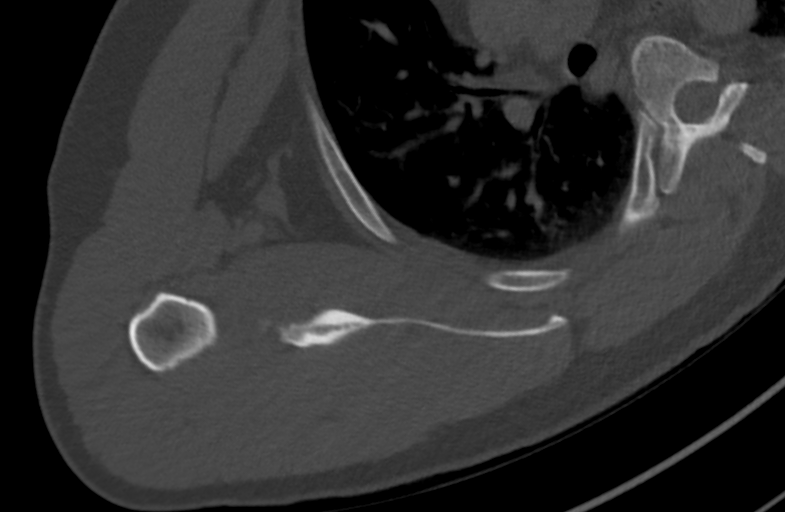
[im 65/120  soft-tissue]
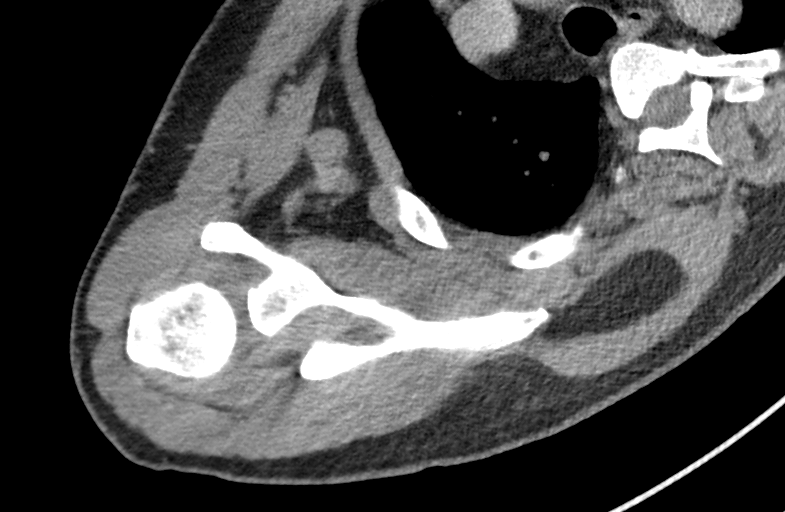
[im 65/120  bone]
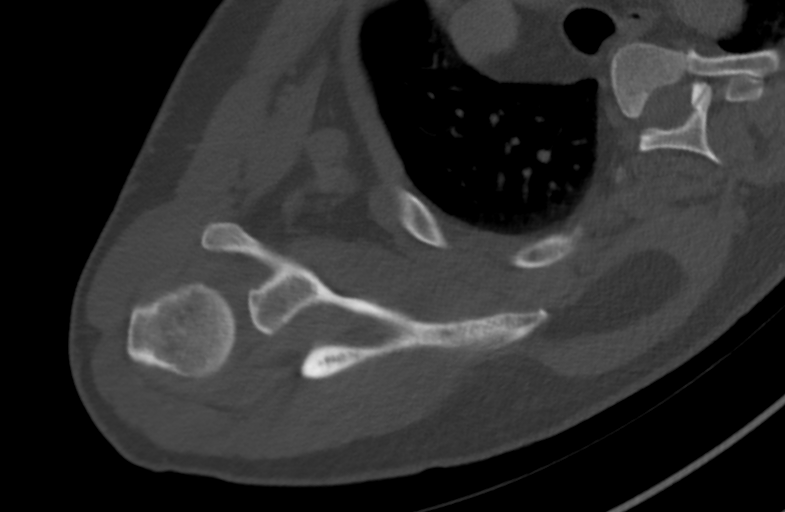
[im 74/120  bone]
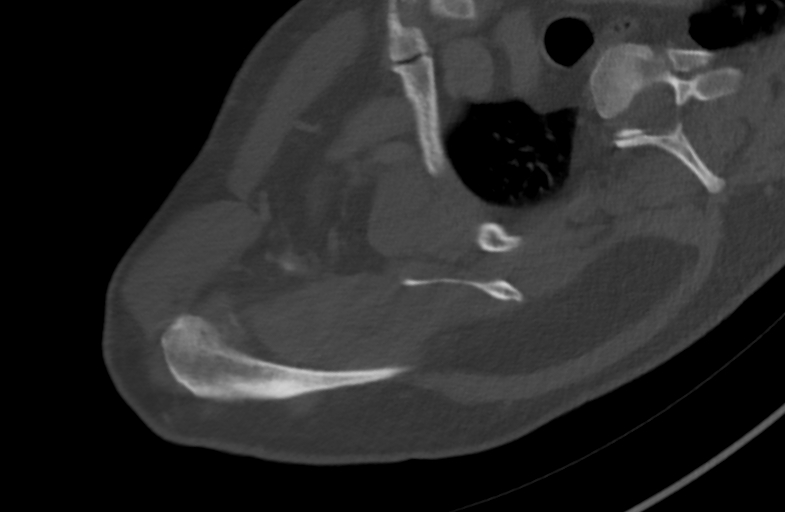
[im 83/120  bone]
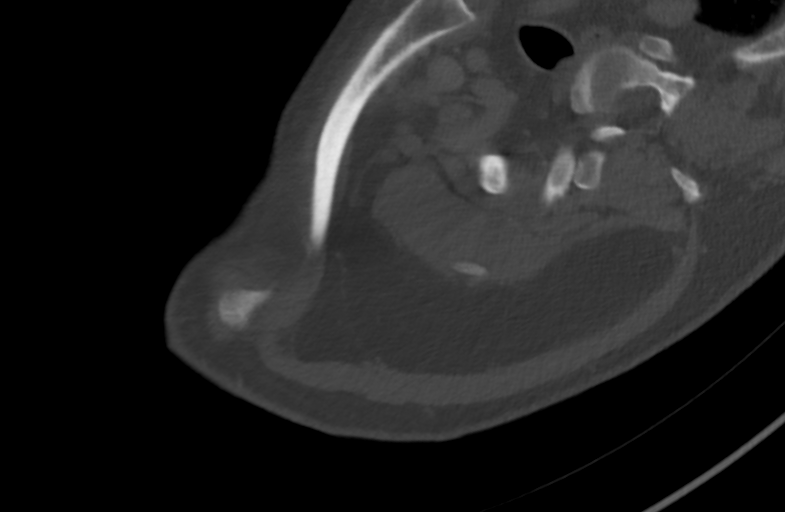
[im 101/120  bone]
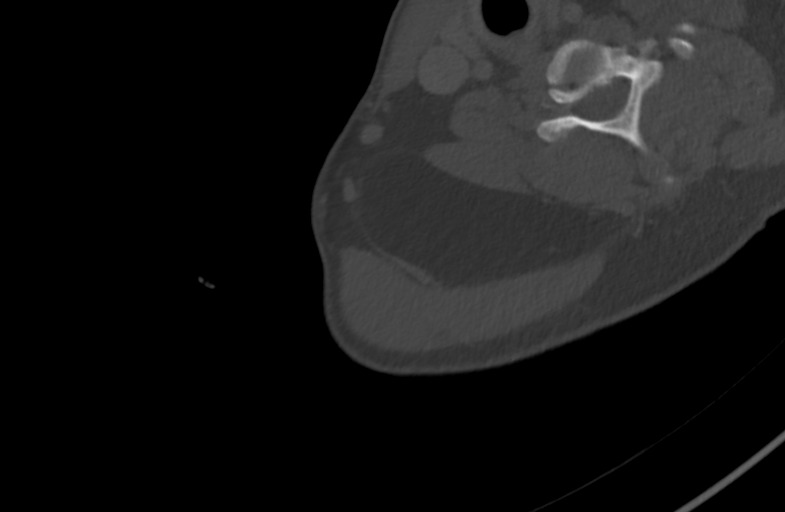
[im 110/120  soft-tissue]
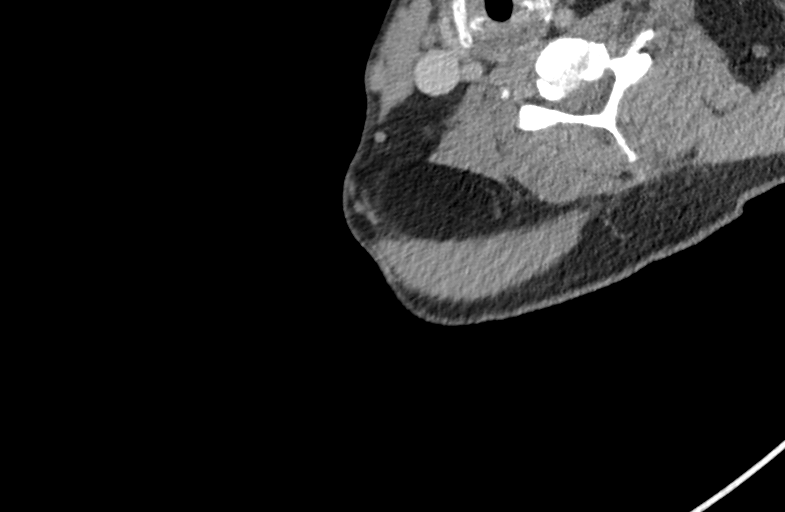
[im 110/120  bone]
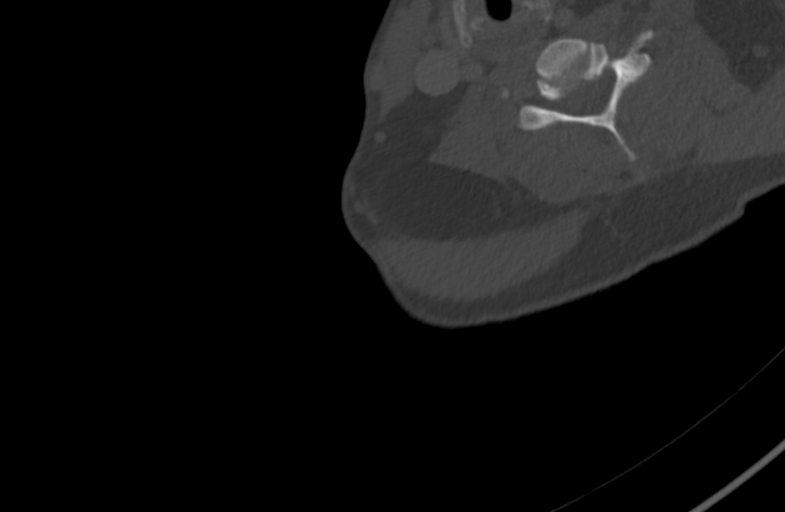

[9 of 14 positions shown; findings below may reference images not displayed]

FINDINGS: Bones/Joint/Cartilage

Mild glenohumeral degenerative changes with mild subchondral cyst
formation in the anterior inferior glenoid. Mild acromioclavicular
degenerative changes. No evidence of acute fracture, dislocation or
humeral head avascular necrosis. Mild facet hypertrophy and uncinate
spurring in the lower cervical spine.

Ligaments

Suboptimally assessed by CT.

Muscles and Tendons

No rotator cuff muscular atrophy. There is no gross impingement on
the rotator cuff tendons. There is a large tubular shaped simple
lipoma superior to the right shoulder, located between the trapezius
and supraspinatus muscles. This measures up to 13 cm in greatest
transverse dimension. The other dimensions are approximately 3.6 x
3.9 cm. This demonstrates no soft tissue components or surrounding
inflammation.

Soft tissues

As above, large simple lipoma superior to the right shoulder. No
aggressive characteristics or osseous erosion.
IMPRESSION: 1. Large simple lipoma superior to the right shoulder, located
between the trapezius and supraspinatus muscles. No aggressive
characteristics.
2. No acute osseous findings. Mild glenohumeral and
acromioclavicular degenerative changes.
3. No gross impingement on the rotator cuff tendons or focal
muscular atrophy.

## 2022-07-07 ENCOUNTER — Ambulatory Visit: Payer: PRIVATE HEALTH INSURANCE | Admitting: Internal Medicine

## 2022-07-12 ENCOUNTER — Other Ambulatory Visit: Payer: Self-pay | Admitting: Internal Medicine

## 2022-07-12 DIAGNOSIS — G2581 Restless legs syndrome: Secondary | ICD-10-CM

## 2022-07-15 ENCOUNTER — Other Ambulatory Visit: Payer: Self-pay | Admitting: Internal Medicine

## 2022-07-15 DIAGNOSIS — G2581 Restless legs syndrome: Secondary | ICD-10-CM

## 2022-07-19 ENCOUNTER — Other Ambulatory Visit: Payer: Self-pay | Admitting: Internal Medicine

## 2022-07-19 DIAGNOSIS — G2581 Restless legs syndrome: Secondary | ICD-10-CM

## 2022-08-30 ENCOUNTER — Other Ambulatory Visit: Payer: Self-pay | Admitting: Internal Medicine

## 2022-08-30 DIAGNOSIS — N529 Male erectile dysfunction, unspecified: Secondary | ICD-10-CM

## 2022-08-30 DIAGNOSIS — G2581 Restless legs syndrome: Secondary | ICD-10-CM

## 2022-08-30 DIAGNOSIS — K219 Gastro-esophageal reflux disease without esophagitis: Secondary | ICD-10-CM

## 2022-08-31 NOTE — Telephone Encounter (Signed)
Refused Mirapex 1 mg because being requested too soon.  Requested Prescriptions  Pending Prescriptions Disp Refills   pramipexole (MIRAPEX) 1 MG tablet [Pharmacy Med Name: PRAMIPEXOLE 1 MG TABLET] 270 tablet 1    Sig: TAKE 2-3 TABLETS BY MOUTH AT BEDTIME     Neurology:  Parkinsonian Agents Passed - 08/30/2022  9:41 PM      Passed - Last BP in normal range    BP Readings from Last 1 Encounters:  04/07/22 112/78         Passed - Last Heart Rate in normal range    Pulse Readings from Last 1 Encounters:  04/07/22 95         Passed - Valid encounter within last 12 months    Recent Outpatient Visits           4 months ago Essential hypertension   [place] at [facility], Jesse Sans, MD   6 months ago Annual physical exam   [place] at [facility], Jesse Sans, MD   10 months ago Acute diffuse otitis externa of left ear   [place] [facility], Jesse Sans, MD   11 months ago Acute diffuse otitis externa of left ear   [place] at [facility], Jesse Sans, MD   1 year ago Essential hypertension   [place] at [facility], Jesse Sans, MD       Future Appointments             In 6 months Army Melia Jesse Sans, MD [place] at [place], [place]             esomeprazole ([place]) 40 MG capsule [Pharmacy Med Name: ESOMEPRAZOLE MAG DR 40 MG CAP] 90 capsule 1    Sig: TAKE ONE CAPSULE BY MOUTH DAILY     Gastroenterology: Proton Pump Inhibitors 2 Passed - 08/30/2022  9:41 PM      Passed - ALT in normal range and within 360 days    ALT  Date Value Ref Range Status  03/30/2022 22 0 - 44 IU/L Final         Passed - AST in normal range and within 360 days    AST  Date Value Ref Range Status  03/30/2022 19 0 - 40 IU/L Final          Passed - Valid encounter within last 12 months    Recent Outpatient Visits           4 months ago Essential hypertension   [place] at [facility], Jesse Sans, MD   6 months ago Annual physical exam   [place] at [facility], Jesse Sans, MD   10 months ago Acute diffuse otitis externa of left ear   [place] at [facility], Jesse Sans, MD   11 months ago Acute diffuse otitis externa of left ear   [place] at [facility], Jesse Sans, MD   1 year ago Essential hypertension   [place] at [facility], Jesse Sans, MD       Future Appointments  In 6 months Army Melia, Jesse Sans, MD [facility] at [facility], [place]             sildenafil (VIAGRA) 100 MG tablet [Pharmacy Med Name: SILDENAFIL 100 MG TABLET] 20 tablet 1    Sig: TAKE ONE TABLET BY MOUTH DAILY AS NEEDED FOR ERECTILE DYSFUNCTION     Urology: Erectile Dysfunction Agents Passed - 08/30/2022  9:41 PM      Passed - AST in normal range and within 360 days    AST  Date Value Ref Range Status  03/30/2022 19 0 - 40 IU/L Final         Passed - ALT in normal range and within 360 days    ALT  Date Value Ref Range Status  03/30/2022 22 0 - 44 IU/L Final         Passed - Last BP in normal range    BP Readings from Last 1 Encounters:  04/07/22 112/78         Passed - Valid encounter within last 12 months    Recent Outpatient Visits           4 months ago Essential hypertension   [place] at [facility], Jesse Sans, MD   6 months ago Annual physical exam   [place] at [facility] Jesse Sans, MD   10 months ago Acute diffuse otitis externa of left ear   [place] at [facility], Jesse Sans, MD   11 months ago Acute diffuse otitis externa of left ear   [place] at [facility], Jesse Sans, MD   1 year ago Essential hypertension   [place] at [facility], Jesse Sans, MD       Future Appointments             In 6 months Army Melia, Jesse Sans, MD [place] at [facility], [facility]

## 2022-09-05 ENCOUNTER — Ambulatory Visit (INDEPENDENT_AMBULATORY_CARE_PROVIDER_SITE_OTHER): Payer: PRIVATE HEALTH INSURANCE | Admitting: Internal Medicine

## 2022-09-05 ENCOUNTER — Encounter: Payer: Self-pay | Admitting: Internal Medicine

## 2022-09-05 VITALS — BP 132/82 | HR 100 | Ht 70.0 in | Wt 263.0 lb

## 2022-09-05 DIAGNOSIS — R7303 Prediabetes: Secondary | ICD-10-CM | POA: Diagnosis not present

## 2022-09-05 DIAGNOSIS — G2581 Restless legs syndrome: Secondary | ICD-10-CM | POA: Diagnosis not present

## 2022-09-05 DIAGNOSIS — N529 Male erectile dysfunction, unspecified: Secondary | ICD-10-CM

## 2022-09-05 DIAGNOSIS — I10 Essential (primary) hypertension: Secondary | ICD-10-CM | POA: Diagnosis not present

## 2022-09-05 LAB — POCT GLYCOSYLATED HEMOGLOBIN (HGB A1C): Hemoglobin A1C: 6 % — AB (ref 4.0–5.6)

## 2022-09-05 MED ORDER — TADALAFIL 20 MG PO TABS: 10.0000 mg | ORAL_TABLET | ORAL | 11 refills | Status: DC | PRN

## 2022-09-05 MED ORDER — PRAMIPEXOLE DIHYDROCHLORIDE 1 MG PO TABS: 5.0000 mg | ORAL_TABLET | Freq: Every day | ORAL | 1 refills | Status: DC

## 2022-09-05 NOTE — Assessment & Plan Note (Signed)
Severe RLS Has increased Mirapex to 4-5 mg at bedtime for effect He has consulted neurology and no other treatment has been effective At this time, the benefits outweigh the risks of higher dosees

## 2022-09-05 NOTE — Progress Notes (Signed)
Date:  09/05/2022   Name:  Kyle Ortega.   DOB:  1976/05/22   MRN:  [id number]   Chief Complaint: Hypertension  Hypertension This is a chronic problem. The problem is controlled. Pertinent negatives include no chest pain, headaches, palpitations or shortness of breath. Past treatments include ACE inhibitors and diuretics.  Diabetes He presents for his follow-up diabetic visit. Diabetes type: prediab. Pertinent negatives for hypoglycemia include no dizziness, headaches or nervousness/anxiousness. Pertinent negatives for diabetes include no chest pain, no fatigue and no weakness.  Weight management - pt is here for weight management follow up.  Started on phentermine on 02/24/22.  Doing well without side effects.  Starting weight 264 lbs.   Today's weight 264 lbs.  Current diet is portion control and current exercise routine is walking.  He has not taken phentermine for several months as it stopped helping. OSA - moderate Epworth scale last visit.  Discussed sleep apnea testing.  He does not have daytime sleepiness on a regular basis and is not interested in a sleep study at this time. RLS - he has had to increase the mirapex recently to 4 or 5 mg at bedtime for benefit.  He needs a new Rx.  Occasionally has symptoms during the day if he naps.  Lab Results  Component Value Date   NA 142 03/30/2022   K 4.9 03/30/2022   CO2 23 03/30/2022   GLUCOSE 100 (H) 03/30/2022   BUN 16 03/30/2022   CREATININE 0.79 03/30/2022   CALCIUM 10.0 03/30/2022   EGFR 110 03/30/2022   GFRNONAA 106 01/22/2020   Lab Results  Component Value Date   CHOL 224 (H) 03/30/2022   HDL 35 (L) 03/30/2022   LDLCALC 158 (H) 03/30/2022   TRIG 168 (H) 03/30/2022   CHOLHDL 6.4 (H) 03/30/2022   Lab Results  Component Value Date   TSH 0.753 09/22/2021   Lab Results  Component Value Date   HGBA1C 6.0 (A) 09/05/2022   Lab Results  Component Value Date   WBC 10.1 03/30/2022   HGB 17.8 (H) 03/30/2022   HCT  54.0 (H) 03/30/2022   MCV 81 03/30/2022   PLT 266 03/30/2022   Lab Results  Component Value Date   ALT 22 03/30/2022   AST 19 03/30/2022   ALKPHOS 95 03/30/2022   BILITOT 0.3 03/30/2022   Lab Results  Component Value Date   VD25OH 35.5 03/31/2015     Review of Systems  Constitutional:  Negative for fatigue and unexpected weight change.  HENT:  Negative for nosebleeds.   Eyes:  Negative for visual disturbance.  Respiratory:  Negative for cough, chest tightness, shortness of breath and wheezing.   Cardiovascular:  Negative for chest pain, palpitations and leg swelling.  Gastrointestinal:  Negative for abdominal pain, constipation and diarrhea.  Musculoskeletal:  Positive for myalgias (RLS).  Neurological:  Negative for dizziness, weakness, light-headedness and headaches.  Psychiatric/Behavioral:  Negative for dysphoric mood and sleep disturbance. The patient is not nervous/anxious.     Patient Active Problem List   Diagnosis Date Noted   Prediabetes 09/05/2022   Erectile dysfunction 09/05/2022   Sprain of medial collateral ligament of left knee 08/24/2021   Low TSH level 08/24/2021   Primary insomnia 01/21/2020   Allergy to alpha-gal 04/22/2018   Polycythemia 08/31/2017   GERD without esophagitis 07/16/2017   Testicular hypofunction 06/29/2016   Complete tear of MCL of knee, right, sequela 04/03/2016   Obesity, Class II, BMI 35-39.9 03/31/2015  Restless leg syndrome 03/31/2015   Essential hypertension 03/31/2015   Mixed hyperlipidemia 03/31/2015   History of nephrolithiasis 03/31/2015    Allergies  Allergen Reactions   Alpha-Gal Anaphylaxis    Can not eat red meat    Augmentin [Amoxicillin-Pot Clavulanate] Diarrhea    C diff colitis   Iodine Other (See Comments)   Requip [Ropinirole] Nausea Only   Shellfish Allergy    Codeine Nausea Only    Past Surgical History:  Procedure Laterality Date   ANTERIOR CRUCIATE LIGAMENT REPAIR Left    LAPAROSCOPIC GASTRIC  BANDING     LAPAROSCOPIC REPAIR AND REMOVAL OF GASTRIC BAND  01/2017   removed.   TENDON REPAIR Right    thumb    Social History   Tobacco Use   Smoking status: Some Days    Packs/day: 0.10    Years: 5.00    Total pack years: 0.50    Types: Cigarettes   Smokeless tobacco: Never  Vaping Use   Vaping Use: Never used  Substance Use Topics   Alcohol use: Yes    Alcohol/week: 0.0 standard drinks of alcohol   Drug use: No     Medication list has been reviewed and updated.  Current Meds  Medication Sig   benazepril-hydrochlorthiazide (LOTENSIN HCT) 20-25 MG tablet Take 1 tablet by mouth daily.   esomeprazole (NEXIUM) 40 MG capsule TAKE ONE CAPSULE BY MOUTH DAILY   Saccharomyces boulardii (PROBIOTIC) 250 MG CAPS Take 250 mg by mouth 2 (two) times daily.   tadalafil (CIALIS) 20 MG tablet Take 0.5-1 tablets (10-20 mg total) by mouth every other day as needed for erectile dysfunction.   [DISCONTINUED] phentermine 37.5 MG capsule Take 1 capsule (37.5 mg total) by mouth every morning.   [DISCONTINUED] pramipexole (MIRAPEX) 1 MG tablet TAKE 2-3 TABLETS BY MOUTH AT BEDTIME   [DISCONTINUED] sildenafil (VIAGRA) 100 MG tablet TAKE ONE TABLET BY MOUTH DAILY AS NEEDED FOR ERECTILE DYSFUNCTION       09/05/2022    8:22 AM 04/07/2022    2:41 PM 02/24/2022    9:16 AM 11/03/2021    8:06 AM  GAD 7 : Generalized Anxiety Score  Nervous, Anxious, on Edge 0 0 0 0  Control/stop worrying 0 0 0 0  Worry too much - different things 0 0 0 0  Trouble relaxing '2 1 1 '$ 0  Restless 0 1 0 0  Easily annoyed or irritable 0 0 0 0  Afraid - awful might happen 0 0 0 0  Total GAD 7 Score '2 2 1 '$ 0  Anxiety Difficulty Not difficult at all Not difficult at all Not difficult at all Not difficult at all       09/05/2022    8:22 AM 04/07/2022    2:41 PM 02/24/2022    9:16 AM  Depression screen PHQ 2/9  Decreased Interest 0 1 0  Down, Depressed, Hopeless 0 0 0  PHQ - 2 Score 0 1 0  Altered sleeping '1 2 1  '$ Tired,  decreased energy '1 1 1  '$ Change in appetite 0 0 1  Feeling bad or failure about yourself  0 0 0  Trouble concentrating 0 0 0  Moving slowly or fidgety/restless 0 0 0  Suicidal thoughts 0 0 0  PHQ-9 Score '2 4 3  '$ Difficult doing work/chores Not difficult at all Not difficult at all Not difficult at all    BP Readings from Last 3 Encounters:  09/05/22 132/82  04/07/22 112/78  02/24/22 138/88  Physical Exam Vitals and nursing note reviewed.  Constitutional:      General: He is not in acute distress.    Appearance: He is well-developed.  HENT:     Head: Normocephalic and atraumatic.  Pulmonary:     Effort: Pulmonary effort is normal. No respiratory distress.  Skin:    General: Skin is warm and dry.     Findings: No rash.  Neurological:     Mental Status: He is alert and oriented to person, place, and time.  Psychiatric:        Mood and Affect: Mood normal.        Behavior: Behavior normal.     Wt Readings from Last 3 Encounters:  09/05/22 263 lb (119.3 kg)  04/07/22 254 lb 9.6 oz (115.5 kg)  02/24/22 264 lb 6.4 oz (119.9 kg)    BP 132/82   Pulse 100   Ht '5\' 10"'$  (1.778 m)   Wt 263 lb (119.3 kg)   SpO2 96%   BMI 37.74 kg/m   Assessment and Plan: Problem List Items Addressed This Visit       Cardiovascular and Mediastinum   Essential hypertension - Primary (Chronic)    Clinically stable exam with well controlled BP on benazepril and hctz. Tolerating medications without side effects. Pt to continue current regimen and low sodium diet.       Relevant Medications   tadalafil (CIALIS) 20 MG tablet     Other   Erectile dysfunction   Relevant Medications   tadalafil (CIALIS) 20 MG tablet   Prediabetes    He reports more thirst recently along with appropriate urination No weight loss or blurred vision A1C today 6.0 - no medications recommended but highly encourage weight loss of 5-10 lbs to slow progression      Relevant Orders   POCT glycosylated  hemoglobin (Hb A1C) (Completed)   Restless leg syndrome (Chronic)    Severe RLS Has increased Mirapex to 4-5 mg at bedtime for effect He has consulted neurology and no other treatment has been effective At this time, the benefits outweigh the risks of higher dosees      Relevant Medications   pramipexole (MIRAPEX) 1 MG tablet     Partially dictated using Editor, commissioning. Any errors are unintentional.  Halina Maidens, MD [facility]  09/05/2022

## 2022-09-05 NOTE — Assessment & Plan Note (Signed)
He reports more thirst recently along with appropriate urination No weight loss or blurred vision A1C today 6.0 - no medications recommended but highly encourage weight loss of 5-10 lbs to slow progression

## 2022-09-05 NOTE — Assessment & Plan Note (Signed)
Clinically stable exam with well controlled BP on benazepril and hctz. Tolerating medications without side effects. Pt to continue current regimen and low sodium diet.

## 2022-09-07 ENCOUNTER — Encounter: Payer: Self-pay | Admitting: Internal Medicine

## 2022-09-08 ENCOUNTER — Other Ambulatory Visit: Payer: Self-pay | Admitting: Internal Medicine

## 2022-09-08 DIAGNOSIS — G2581 Restless legs syndrome: Secondary | ICD-10-CM

## 2022-09-08 MED ORDER — PRAMIPEXOLE DIHYDROCHLORIDE 1.5 MG PO TABS: 4.5000 mg | ORAL_TABLET | Freq: Every day | ORAL | 1 refills | Status: DC

## 2022-09-08 NOTE — Telephone Encounter (Signed)
Please review.  KP

## 2023-02-28 ENCOUNTER — Encounter: Payer: Self-pay | Admitting: Internal Medicine

## 2023-02-28 ENCOUNTER — Other Ambulatory Visit
Admission: RE | Admit: 2023-02-28 | Discharge: 2023-02-28 | Disposition: A | Discharge status: Home / Self Care | Payer: PRIVATE HEALTH INSURANCE | Attending: Internal Medicine | Admitting: Internal Medicine

## 2023-02-28 ENCOUNTER — Ambulatory Visit (INDEPENDENT_AMBULATORY_CARE_PROVIDER_SITE_OTHER): Payer: PRIVATE HEALTH INSURANCE | Admitting: Internal Medicine

## 2023-02-28 VITALS — BP 128/86 | HR 85 | Ht 70.0 in | Wt 270.0 lb

## 2023-02-28 DIAGNOSIS — I10 Essential (primary) hypertension: Secondary | ICD-10-CM | POA: Diagnosis present

## 2023-02-28 DIAGNOSIS — E782 Mixed hyperlipidemia: Secondary | ICD-10-CM | POA: Insufficient documentation

## 2023-02-28 DIAGNOSIS — Z0000 Encounter for general adult medical examination without abnormal findings: Secondary | ICD-10-CM

## 2023-02-28 DIAGNOSIS — D751 Secondary polycythemia: Secondary | ICD-10-CM

## 2023-02-28 DIAGNOSIS — K219 Gastro-esophageal reflux disease without esophagitis: Secondary | ICD-10-CM | POA: Diagnosis not present

## 2023-02-28 DIAGNOSIS — G2581 Restless legs syndrome: Secondary | ICD-10-CM

## 2023-02-28 DIAGNOSIS — Z125 Encounter for screening for malignant neoplasm of prostate: Secondary | ICD-10-CM | POA: Insufficient documentation

## 2023-02-28 DIAGNOSIS — E66812 Obesity, class 2: Secondary | ICD-10-CM

## 2023-02-28 DIAGNOSIS — R7303 Prediabetes: Secondary | ICD-10-CM | POA: Insufficient documentation

## 2023-02-28 DIAGNOSIS — Z1211 Encounter for screening for malignant neoplasm of colon: Secondary | ICD-10-CM | POA: Diagnosis not present

## 2023-02-28 DIAGNOSIS — E669 Obesity, unspecified: Secondary | ICD-10-CM

## 2023-02-28 LAB — POCT URINALYSIS DIPSTICK
Bilirubin, UA: NEGATIVE
Blood, UA: NEGATIVE
Glucose, UA: NEGATIVE
Ketones, UA: NEGATIVE
Leukocytes, UA: NEGATIVE
Nitrite, UA: NEGATIVE
Protein, UA: NEGATIVE
Spec Grav, UA: 1.015 (ref 1.010–1.025)
Urobilinogen, UA: 0.2 U/dL
pH, UA: 5 (ref 5.0–8.0)

## 2023-02-28 LAB — HEMOGLOBIN A1C
Hgb A1c MFr Bld: 5.8 % — ABNORMAL HIGH (ref 4.8–5.6)
Mean Plasma Glucose: 119.76 mg/dL

## 2023-02-28 LAB — CBC WITH DIFFERENTIAL/PLATELET
Abs Immature Granulocytes: 0.06 10*3/uL (ref 0.00–0.07)
Basophils Absolute: 0 10*3/uL (ref 0.0–0.1)
Basophils Relative: 0 %
Eosinophils Absolute: 0.2 10*3/uL (ref 0.0–0.5)
Eosinophils Relative: 2 %
HCT: 49.1 % (ref 39.0–52.0)
Hemoglobin: 16.4 g/dL (ref 13.0–17.0)
Immature Granulocytes: 1 %
Lymphocytes Relative: 40 %
Lymphs Abs: 3.3 10*3/uL (ref 0.7–4.0)
MCH: 27.5 pg (ref 26.0–34.0)
MCHC: 33.4 g/dL (ref 30.0–36.0)
MCV: 82.2 fL (ref 80.0–100.0)
Monocytes Absolute: 0.5 10*3/uL (ref 0.1–1.0)
Monocytes Relative: 6 %
Neutro Abs: 4.1 10*3/uL (ref 1.7–7.7)
Neutrophils Relative %: 51 %
Platelets: 298 10*3/uL (ref 150–400)
RBC: 5.97 MIL/uL — ABNORMAL HIGH (ref 4.22–5.81)
RDW: 14.6 % (ref 11.5–15.5)
WBC: 8.1 10*3/uL (ref 4.0–10.5)
nRBC: 0 % (ref 0.0–0.2)

## 2023-02-28 LAB — LIPID PANEL
Cholesterol: 238 mg/dL — ABNORMAL HIGH (ref 0–200)
HDL: 34 mg/dL — ABNORMAL LOW (ref >40)
LDL Cholesterol: 179 mg/dL — ABNORMAL HIGH (ref 0–99)
Total CHOL/HDL Ratio: 7 ratio
Triglycerides: 127 mg/dL (ref <150)
VLDL: 25 mg/dL (ref 0–40)

## 2023-02-28 LAB — COMPREHENSIVE METABOLIC PANEL
ALT: 26 U/L (ref 0–44)
AST: 21 U/L (ref 15–41)
Albumin: 4.4 g/dL (ref 3.5–5.0)
Alkaline Phosphatase: 79 U/L (ref 38–126)
Anion gap: 9 (ref 5–15)
BUN: 17 mg/dL (ref 6–20)
CO2: 25 mmol/L (ref 22–32)
Calcium: 9.2 mg/dL (ref 8.9–10.3)
Chloride: 101 mmol/L (ref 98–111)
Creatinine, Ser: 0.79 mg/dL (ref 0.61–1.24)
GFR, Estimated: >60 mL/min (ref >60)
Glucose, Bld: 101 mg/dL — ABNORMAL HIGH (ref 70–99)
Potassium: 3.9 mmol/L (ref 3.5–5.1)
Sodium: 135 mmol/L (ref 135–145)
Total Bilirubin: 0.4 mg/dL (ref 0.3–1.2)
Total Protein: 7.5 g/dL (ref 6.5–8.1)

## 2023-02-28 LAB — PSA: Prostatic Specific Antigen: 1.87 ng/mL (ref 0.00–4.00)

## 2023-02-28 MED ORDER — ESOMEPRAZOLE MAGNESIUM 40 MG PO CPDR: 40.0000 mg | DELAYED_RELEASE_CAPSULE | Freq: Every day | ORAL | 3 refills | Status: AC

## 2023-02-28 MED ORDER — BENAZEPRIL-HYDROCHLOROTHIAZIDE 20-25 MG PO TABS: 1.0000 | ORAL_TABLET | Freq: Every day | ORAL | 3 refills | Status: DC

## 2023-02-28 MED ORDER — PRAMIPEXOLE DIHYDROCHLORIDE 1.5 MG PO TABS: 4.5000 mg | ORAL_TABLET | Freq: Every day | ORAL | 3 refills | Status: AC

## 2023-02-28 NOTE — Assessment & Plan Note (Signed)
Consider SGLT-2 if diabetic range A1C to help with weight loss

## 2023-02-28 NOTE — Assessment & Plan Note (Signed)
On high dose Mirapex to control his symptoms

## 2023-02-28 NOTE — Progress Notes (Signed)
Date:  02/28/2023   Name:  Kyle Ortega.   DOB:  [birth year]   MRN:  [id number]   Chief Complaint: Annual Exam (No exercising, sleeps "fine") Kyle Ortega. is a 47 y.o. male who presents today for his Complete Annual Exam. He feels fairly well. He reports exercising rarely. He reports he is sleeping fairly well.   Colonoscopy: ?2005 Dr. Barbie Banner in [facility]  Immunization History  Administered Date(s) Administered   Influenza,inj,Quad PF,6+ Mos 03/31/2015, 04/04/2016, 04/22/2018   PFIZER(Purple Top)SARS-COV-2 Vaccination 08/18/2019, 09/04/2019   Tdap 03/31/2015, 09/26/2015   Health Maintenance Due  Topic Date Due   Colonoscopy  Never done    Lab Results  Component Value Date   PSA1 2.9 03/30/2022   PSA1 1.5 01/22/2020   PSA1 1.8 07/16/2017   Hypertension This is a chronic problem. The problem is controlled. Pertinent negatives include no chest pain, headaches, palpitations or shortness of breath. Past treatments include ACE inhibitors and diuretics.  Gastroesophageal Reflux He complains of heartburn. He reports no abdominal pain, no chest pain, no choking or no wheezing. This is a recurrent problem. Pertinent negatives include no fatigue. He has tried a PPI for the symptoms.    Lab Results  Component Value Date   NA 142 03/30/2022   K 4.9 03/30/2022   CO2 23 03/30/2022   GLUCOSE 100 (H) 03/30/2022   BUN 16 03/30/2022   CREATININE 0.79 03/30/2022   CALCIUM 10.0 03/30/2022   EGFR 110 03/30/2022   GFRNONAA 106 01/22/2020   Lab Results  Component Value Date   CHOL 224 (H) 03/30/2022   HDL 35 (L) 03/30/2022   LDLCALC 158 (H) 03/30/2022   TRIG 168 (H) 03/30/2022   CHOLHDL 6.4 (H) 03/30/2022   Lab Results  Component Value Date   TSH 0.753 09/22/2021   Lab Results  Component Value Date   HGBA1C 6.0 (A) 09/05/2022   Lab Results  Component Value Date   WBC 10.1 03/30/2022   HGB 17.8 (H) 03/30/2022   HCT 54.0 (H) 03/30/2022   MCV 81 03/30/2022   PLT  266 03/30/2022   Lab Results  Component Value Date   ALT 22 03/30/2022   AST 19 03/30/2022   ALKPHOS 95 03/30/2022   BILITOT 0.3 03/30/2022   Lab Results  Component Value Date   VD25OH 35.5 03/31/2015     Review of Systems  Constitutional:  Positive for unexpected weight change. Negative for appetite change, chills, diaphoresis and fatigue.  HENT:  Negative for hearing loss, tinnitus, trouble swallowing and voice change.   Eyes:  Negative for visual disturbance.  Respiratory:  Negative for choking, shortness of breath and wheezing.   Cardiovascular:  Negative for chest pain, palpitations and leg swelling.  Gastrointestinal:  Positive for heartburn. Negative for abdominal pain, blood in stool, constipation and diarrhea.  Genitourinary:  Negative for difficulty urinating, dysuria and frequency.  Musculoskeletal:  Negative for arthralgias, back pain and myalgias.  Skin:  Negative for color change and rash.  Neurological:  Negative for dizziness, syncope and headaches.  Hematological:  Negative for adenopathy.  Psychiatric/Behavioral:  Negative for dysphoric mood and sleep disturbance. The patient is not nervous/anxious.     Patient Active Problem List   Diagnosis Date Noted   Prediabetes 09/05/2022   Erectile dysfunction 09/05/2022   Sprain of medial collateral ligament of left knee 08/24/2021   Low TSH level 08/24/2021   Primary insomnia 01/21/2020   Allergy to alpha-gal 04/22/2018   Polycythemia  08/31/2017   GERD without esophagitis 07/16/2017   Testicular hypofunction 06/29/2016   Complete tear of MCL of knee, right, sequela 04/03/2016   Obesity, Class II, BMI 35-39.9 03/31/2015   Restless leg syndrome 03/31/2015   Essential hypertension 03/31/2015   Mixed hyperlipidemia 03/31/2015   History of nephrolithiasis 03/31/2015    Allergies  Allergen Reactions   Alpha-Gal Anaphylaxis    Can not eat red meat    Augmentin [Amoxicillin-Pot Clavulanate] Diarrhea    C diff  colitis   Iodine Other (See Comments)   Requip [Ropinirole] Nausea Only   Shellfish Allergy    Codeine Nausea Only    Past Surgical History:  Procedure Laterality Date   ANTERIOR CRUCIATE LIGAMENT REPAIR Left    LAPAROSCOPIC GASTRIC BANDING     LAPAROSCOPIC REPAIR AND REMOVAL OF GASTRIC BAND  01/2017   removed.   TENDON REPAIR Right    thumb    Social History   Tobacco Use   Smoking status: Some Days    Current packs/day: 0.10    Average packs/day: 0.1 packs/day for 5.0 years (0.5 ttl pk-yrs)    Types: Cigarettes   Smokeless tobacco: Never  Vaping Use   Vaping status: Never Used  Substance Use Topics   Alcohol use: Yes    Alcohol/week: 0.0 standard drinks of alcohol   Drug use: No     Medication list has been reviewed and updated.  Current Meds  Medication Sig   Saccharomyces boulardii (PROBIOTIC) 250 MG CAPS Take 250 mg by mouth 2 (two) times daily.   tadalafil (CIALIS) 20 MG tablet Take 0.5-1 tablets (10-20 mg total) by mouth every other day as needed for erectile dysfunction.   [DISCONTINUED] benazepril-hydrochlorthiazide (LOTENSIN HCT) 20-25 MG tablet Take 1 tablet by mouth daily.   [DISCONTINUED] esomeprazole (NEXIUM) 40 MG capsule TAKE ONE CAPSULE BY MOUTH DAILY   [DISCONTINUED] pramipexole (MIRAPEX) 1.5 MG tablet Take 3 tablets (4.5 mg total) by mouth at bedtime.       02/28/2023    8:31 AM 09/05/2022    8:22 AM 04/07/2022    2:41 PM 02/24/2022    9:16 AM  GAD 7 : Generalized Anxiety Score  Nervous, Anxious, on Edge 0 0 0 0  Control/stop worrying 0 0 0 0  Worry too much - different things 0 0 0 0  Trouble relaxing 1 2 1 1   Restless 0 0 1 0  Easily annoyed or irritable 0 0 0 0  Afraid - awful might happen 0 0 0 0  Total GAD 7 Score 1 2 2 1   Anxiety Difficulty Not difficult at all Not difficult at all Not difficult at all Not difficult at all       02/28/2023    8:31 AM 09/05/2022    8:22 AM 04/07/2022    2:41 PM  Depression screen PHQ 2/9  Decreased  Interest 0 0 1  Down, Depressed, Hopeless 0 0 0  PHQ - 2 Score 0 0 1  Altered sleeping 0 1 2  Tired, decreased energy 1 1 1   Change in appetite 0 0 0  Feeling bad or failure about yourself  0 0 0  Trouble concentrating 0 0 0  Moving slowly or fidgety/restless 0 0 0  Suicidal thoughts 0 0 0  PHQ-9 Score 1 2 4   Difficult doing work/chores Not difficult at all Not difficult at all Not difficult at all    BP Readings from Last 3 Encounters:  02/28/23 128/86  09/05/22 132/82  04/07/22 112/78    Physical Exam Vitals and nursing note reviewed.  Constitutional:      Appearance: Normal appearance. He is well-developed.  HENT:     Head: Normocephalic.     Right Ear: Tympanic membrane, ear canal and external ear normal.     Left Ear: Tympanic membrane, ear canal and external ear normal.     Nose: Nose normal.  Eyes:     Conjunctiva/sclera: Conjunctivae normal.     Pupils: Pupils are equal, round, and reactive to light.  Neck:     Thyroid: No thyromegaly.     Vascular: No carotid bruit.  Cardiovascular:     Rate and Rhythm: Normal rate and regular rhythm.     Heart sounds: Normal heart sounds.  Pulmonary:     Effort: Pulmonary effort is normal.     Breath sounds: Normal breath sounds. No wheezing.  Chest:  Breasts:    Right: No mass.     Left: No mass.  Abdominal:     General: Bowel sounds are normal.     Palpations: Abdomen is soft.     Tenderness: There is no abdominal tenderness.  Musculoskeletal:        General: Normal range of motion.     Cervical back: Normal range of motion and neck supple.  Lymphadenopathy:     Cervical: No cervical adenopathy.  Skin:    General: Skin is warm and dry.  Neurological:     Mental Status: He is alert and oriented to person, place, and time.     Deep Tendon Reflexes: Reflexes are normal and symmetric.  Psychiatric:        Attention and Perception: Attention normal.        Mood and Affect: Mood normal.        Thought Content:  Thought content normal.     Wt Readings from Last 3 Encounters:  02/28/23 270 lb (122.5 kg)  09/05/22 263 lb (119.3 kg)  04/07/22 254 lb 9.6 oz (115.5 kg)    BP 128/86 (BP Location: Right Arm, Cuff Size: Large)   Pulse 85   Ht 5\' 10"  (1.778 m)   Wt 270 lb (122.5 kg)   SpO2 98%   BMI 38.74 kg/m   Assessment and Plan:  Problem List Items Addressed This Visit       Unprioritized   Restless leg syndrome (Chronic)    On high dose Mirapex to control his symptoms       Relevant Medications   pramipexole (MIRAPEX) 1.5 MG tablet   Prediabetes    Controlled with diet in the past but recent weight gain Will check labs, re-enforce diet and weight loss Consider Farxiga or Jardiance to help with weight loss Lab Results  Component Value Date   HGBA1C 6.0 (A) 09/05/2022   HGBA1C 6.0 (H) 03/30/2022         Relevant Orders   Hemoglobin A1c   Polycythemia    Last CBC improved - if higher, will consider Hematology referral      Obesity, Class II, BMI 35-39.9 (Chronic)    Consider SGLT-2 if diabetic range A1C to help with weight loss      Mixed hyperlipidemia (Chronic)    Currently treated with diet changes only Lab Results  Component Value Date   LDLCALC 158 (H) 03/30/2022         Relevant Medications   benazepril-hydrochlorthiazide (LOTENSIN HCT) 20-25 MG tablet   Other Relevant Orders   Lipid panel   GERD  without esophagitis (Chronic)    Reflux symptoms are minimal on current therapy - nexium. No red flag signs such as weight loss, n/v, melena       Relevant Medications   esomeprazole (NEXIUM) 40 MG capsule   Other Relevant Orders   CBC with Differential/Platelet   Essential hypertension (Chronic)    Normal exam with stable BP on lotensin hct. No concerns or side effects to current medication. No change in regimen; continue low sodium diet.       Relevant Medications   benazepril-hydrochlorthiazide (LOTENSIN HCT) 20-25 MG tablet   Other Relevant  Orders   CBC with Differential/Platelet   Comprehensive metabolic panel   POCT urinalysis dipstick (Completed)   Other Visit Diagnoses     Annual physical exam    -  Primary   needs to work on diet changes and weight loss declines flu vaccine   Relevant Orders   CBC with Differential/Platelet   Comprehensive metabolic panel   Hemoglobin A1c   Lipid panel   PSA   Colon cancer screening       Relevant Orders   Ambulatory referral to Gastroenterology   Prostate cancer screening       Relevant Orders   PSA       No follow-ups on file.    Reubin Milan, MD [facility] [place]

## 2023-02-28 NOTE — Assessment & Plan Note (Signed)
Reflux symptoms are minimal on current therapy - nexium. No red flag signs such as weight loss, n/v, melena

## 2023-02-28 NOTE — Assessment & Plan Note (Signed)
Last CBC improved - if higher, will consider Hematology referral

## 2023-02-28 NOTE — Assessment & Plan Note (Signed)
Currently treated with diet changes only Lab Results  Component Value Date   LDLCALC 158 (H) 03/30/2022

## 2023-02-28 NOTE — Assessment & Plan Note (Addendum)
Controlled with diet in the past but recent weight gain Will check labs, re-enforce diet and weight loss Consider Marcelline Deist or Jardiance to help with weight loss Lab Results  Component Value Date   HGBA1C 6.0 (A) 09/05/2022   HGBA1C 6.0 (H) 03/30/2022

## 2023-02-28 NOTE — Assessment & Plan Note (Signed)
Normal exam with stable BP on lotensin hct. No concerns or side effects to current medication. No change in regimen; continue low sodium diet.

## 2023-03-02 ENCOUNTER — Encounter: Payer: PRIVATE HEALTH INSURANCE | Admitting: Internal Medicine

## 2023-03-08 ENCOUNTER — Encounter: Payer: Self-pay | Admitting: *Deleted

## 2023-04-18 ENCOUNTER — Encounter: Payer: Self-pay | Admitting: Internal Medicine

## 2023-09-27 ENCOUNTER — Other Ambulatory Visit: Payer: Self-pay | Admitting: Internal Medicine

## 2023-09-27 DIAGNOSIS — N529 Male erectile dysfunction, unspecified: Secondary | ICD-10-CM

## 2023-09-28 ENCOUNTER — Other Ambulatory Visit: Payer: Self-pay

## 2024-02-08 ENCOUNTER — Other Ambulatory Visit: Payer: Self-pay | Admitting: Internal Medicine

## 2024-02-08 DIAGNOSIS — G2581 Restless legs syndrome: Secondary | ICD-10-CM

## 2024-02-08 DIAGNOSIS — I10 Essential (primary) hypertension: Secondary | ICD-10-CM

## 2024-02-08 DIAGNOSIS — K219 Gastro-esophageal reflux disease without esophagitis: Secondary | ICD-10-CM

## 2024-02-12 NOTE — Telephone Encounter (Signed)
 Requested medications are due for refill today.  Yes - mail away pharmacy  Requested medications are on the active medications list.  yes  Last refill. 02/28/2023 #90 3 rf  Future visit scheduled.   no  Notes to clinic.  Pt was last seen 02/28/2023  - unclear if pt was to rtc in 6 months. Please advise.    Requested Prescriptions  Pending Prescriptions Disp Refills   pramipexole  (MIRAPEX ) 1.5 MG tablet 270 tablet 2    Sig: Take 3 tablets by mouth at bedtime.     Neurology:  Parkinsonian Agents Failed - 02/12/2024  9:41 AM      Failed - Valid encounter within last 12 months    Recent Outpatient Visits   None            Passed - Last BP in normal range    BP Readings from Last 1 Encounters:  02/28/23 128/86         Passed - Last Heart Rate in normal range    Pulse Readings from Last 1 Encounters:  02/28/23 85          esomeprazole  (NEXIUM ) 40 MG capsule 90 capsule 2    Sig: Take 1 capsule by mouth daily.     Gastroenterology: Proton Pump Inhibitors 2 Failed - 02/12/2024  9:41 AM      Failed - Valid encounter within last 12 months    Recent Outpatient Visits   None            Passed - ALT in normal range and within 360 days    ALT  Date Value Ref Range Status  02/28/2023 26 0 - 44 U/L Final         Passed - AST in normal range and within 360 days    AST  Date Value Ref Range Status  02/28/2023 21 15 - 41 U/L Final          benazepril -hydrochlorthiazide (LOTENSIN  HCT) 20-25 MG tablet 90 tablet 2    Sig: Take 1 tablet by mouth daily.     Cardiovascular:  ACEI + Diuretic Combos Failed - 02/12/2024  9:41 AM      Failed - Na in normal range and within 180 days    Sodium  Date Value Ref Range Status  02/28/2023 135 135 - 145 mmol/L Final  03/30/2022 142 134 - 144 mmol/L Final         Failed - K in normal range and within 180 days    Potassium  Date Value Ref Range Status  02/28/2023 3.9 3.5 - 5.1 mmol/L Final         Failed - Cr in normal range and  within 180 days    Creatinine, Ser  Date Value Ref Range Status  02/28/2023 0.79 0.61 - 1.24 mg/dL Final         Failed - eGFR is 30 or above and within 180 days    GFR calc Af Amer  Date Value Ref Range Status  01/22/2020 122 >59 mL/min/1.73 Final    Comment:    **[facility] currently reports eGFR in compliance with the current**   recommendations of the [employer]. [facility] will   update reporting as new guidelines are published from the NKF-ASN   Task force.    GFR, Estimated  Date Value Ref Range Status  02/28/2023 >60 >60 mL/min Final    Comment:    (NOTE) Calculated using the CKD-EPI Creatinine Equation (2021)    eGFR  Date Value Ref Range Status  03/30/2022 110 >59 mL/min/1.73 Final         Failed - Valid encounter within last 6 months    Recent Outpatient Visits   None            Passed - Patient is not pregnant      Passed - Last BP in normal range    BP Readings from Last 1 Encounters:  02/28/23 128/86

## 2024-06-02 LAB — OPHTHALMOLOGY REPORT-SCANNED

## 2024-07-24 ENCOUNTER — Ambulatory Visit: Payer: PRIVATE HEALTH INSURANCE | Admitting: Physician Assistant

## 2024-07-24 VITALS — BP 144/102 | HR 92 | Temp 98.4°F | Ht 70.0 in | Wt 289.8 lb

## 2024-07-24 DIAGNOSIS — Z1211 Encounter for screening for malignant neoplasm of colon: Secondary | ICD-10-CM | POA: Diagnosis not present

## 2024-07-24 DIAGNOSIS — I10 Essential (primary) hypertension: Secondary | ICD-10-CM | POA: Diagnosis not present

## 2024-07-24 DIAGNOSIS — K9049 Malabsorption due to intolerance, not elsewhere classified: Secondary | ICD-10-CM

## 2024-07-24 DIAGNOSIS — E66813 Obesity, class 3: Secondary | ICD-10-CM

## 2024-07-24 DIAGNOSIS — G2581 Restless legs syndrome: Secondary | ICD-10-CM | POA: Diagnosis not present

## 2024-07-24 DIAGNOSIS — Z2821 Immunization not carried out because of patient refusal: Secondary | ICD-10-CM | POA: Diagnosis not present

## 2024-07-24 DIAGNOSIS — Z6841 Body Mass Index (BMI) 40.0 and over, adult: Secondary | ICD-10-CM | POA: Diagnosis not present

## 2024-07-24 DIAGNOSIS — Z0000 Encounter for general adult medical examination without abnormal findings: Secondary | ICD-10-CM

## 2024-07-24 DIAGNOSIS — R7303 Prediabetes: Secondary | ICD-10-CM | POA: Diagnosis not present

## 2024-07-24 MED ORDER — LISINOPRIL-HYDROCHLOROTHIAZIDE 20-25 MG PO TABS: 1.0000 | ORAL_TABLET | Freq: Every day | ORAL | 1 refills | Status: AC

## 2024-07-24 MED ORDER — IRON (FERROUS SULFATE) 325 (65 FE) MG PO TABS: 325.0000 mg | ORAL_TABLET | ORAL | 1 refills | Status: AC

## 2024-07-24 NOTE — Assessment & Plan Note (Addendum)
 Repeat A1c today.  Orders:    Hemoglobin A1c

## 2024-07-24 NOTE — Assessment & Plan Note (Addendum)
 Patient with weight gain of about 20 pounds in the last year.  Concern for metabolic comorbidities.  Will check nonfasting labs today.  Very briefly discussed GLP therapy; will wait for labs to result and make our selection accordingly.  Reviewed that patient would need a separate visit for a comprehensive nutrition and activity assessment with review of medication administration and side effects; this visit could be virtual if he so chooses.  Orders:   Lipid panel   Hemoglobin A1c   PSA   CBC with Differential/Platelet   Comprehensive metabolic panel with GFR   TSH

## 2024-07-24 NOTE — Progress Notes (Signed)
 "   Date:  07/24/2024   Name:  Kyle Ortega.   DOB:  07/02/1975   MRN:  [id number]   Chief Complaint: Establish Care  HPI  [name] is a very pleasant 49 year old male with history of obesity, HTN, HLD, pre-DM who presents new to me today as a transition of care from my recently retired animator Dr. Leita Adie.  Patient works as an Advertising Copywriter.  Last year he was referred for colonoscopy but due to insurance difficulties this was not completed; he is open to reopening the referral.  He raises concerns for his weight and would like to try GLP therapy.  Last A1c 5.8%.  He is up about 20 pounds over the last year.  He also mentions some type of food intolerance which causes diffuse pruritic rash.  He has not been able to identify the particular food, but states that this most often happens after Asian cuisine so he suspects possibly soy, sesame, MSG etc. He would like to check food allergy profile.  He has previously been diagnosed with alpha gal years ago and has since avoided red meats for the most part; reports he is not much bothered by this at present.  He does mention that sometimes the left ear canal feels wet and would like me to have a look.  Last Physical: Sept 2024 Last Dental Exam: 2-3y ago Last Eye Exam: 38m ago Last CRC screen: circa 2005 for suspected IBS/IBD - was clear Last PSA: 02/28/23 was 1.87 Immunizations Due: flu, Prevnar 20, Shingrix    Medication list has been reviewed and updated.  Active Medications[1]   Review of Systems  Patient Active Problem List   Diagnosis Date Noted   Prediabetes 09/05/2022   Erectile dysfunction 09/05/2022   Sprain of medial collateral ligament of left knee 08/24/2021   Low TSH level 08/24/2021   Primary insomnia 01/21/2020   Allergy to alpha-gal 04/22/2018   Polycythemia 08/31/2017   GERD without esophagitis 07/16/2017   Testicular hypofunction 06/29/2016   Complete tear of MCL of knee, right, sequela 04/03/2016   Class  3 severe obesity with serious comorbidity in adult [facility]) 03/31/2015   Restless leg syndrome 03/31/2015   Essential hypertension 03/31/2015   Mixed hyperlipidemia 03/31/2015   History of nephrolithiasis 03/31/2015    Allergies[2]  Immunization History  Administered Date(s) Administered   Influenza,inj,Quad PF,6+ Mos 03/31/2015, 04/04/2016, 04/22/2018   PFIZER(Purple Top)SARS-COV-2 Vaccination 08/18/2019, 09/04/2019   Tdap 03/31/2015, 09/26/2015    Past Surgical History:  Procedure Laterality Date   ANTERIOR CRUCIATE LIGAMENT REPAIR Left    LAPAROSCOPIC GASTRIC BANDING     LAPAROSCOPIC REPAIR AND REMOVAL OF GASTRIC BAND  01/2017   removed.   TENDON REPAIR Right    thumb    Social History[3]  Family History  Problem Relation Age of Onset   Diabetes Father        committed suicide   Heart disease Father    Stroke Father    Hypertension Father    Asthma Mother    Rheum arthritis Mother         07/24/2024    1:14 PM 02/28/2023    8:31 AM 09/05/2022    8:22 AM 04/07/2022    2:41 PM  GAD 7 : Generalized Anxiety Score  Nervous, Anxious, on Edge 0 0  0  0   Control/stop worrying 0 0  0  0   Worry too much - different things 0 0  0  0  Trouble relaxing 0 1  2  1    Restless 0 0  0  1   Easily annoyed or irritable 0 0  0  0   Afraid - awful might happen 0 0  0  0   Total GAD 7 Score 0 1 2 2   Anxiety Difficulty  Not difficult at all Not difficult at all Not difficult at all     Data saved with a previous flowsheet row definition       07/24/2024    1:14 PM 02/28/2023    8:31 AM 09/05/2022    8:22 AM  Depression screen PHQ 2/9  Decreased Interest 0 0 0  Down, Depressed, Hopeless 0 0 0  PHQ - 2 Score 0 0 0  Altered sleeping  0 1  Tired, decreased energy  1 1  Change in appetite  0 0  Feeling bad or failure about yourself   0 0  Trouble concentrating  0 0  Moving slowly or fidgety/restless  0 0  Suicidal thoughts  0 0  PHQ-9 Score  1  2   Difficult doing  work/chores  Not difficult at all Not difficult at all     Data saved with a previous flowsheet row definition    BP Readings from Last 3 Encounters:  07/24/24 (!) 144/102  02/28/23 128/86  09/05/22 132/82    Wt Readings from Last 3 Encounters:  07/24/24 289 lb 12.8 oz (131.5 kg)  02/28/23 270 lb (122.5 kg)  09/05/22 263 lb (119.3 kg)    BP (!) 144/102 (Cuff Size: Large)   Pulse 92   Temp 98.4 F (36.9 C) (Oral)   Ht 5' 10 (1.778 m)   Wt 289 lb 12.8 oz (131.5 kg)   SpO2 95%   BMI 41.58 kg/m   Physical Exam Vitals and nursing note reviewed.  Constitutional:      Appearance: Normal appearance. He is obese.  HENT:     Right Ear: A middle ear effusion (mild) is present. Tympanic membrane is not erythematous.     Left Ear: A middle ear effusion (mild) is present. Tympanic membrane is not erythematous.     Ears:     Comments: EAC clear bilaterally with good view of TM which is without effusion or erythema.    Nose: Nose normal.     Mouth/Throat:     Mouth: Mucous membranes are moist. No oral lesions.     Dentition: Normal dentition.     Pharynx: No posterior oropharyngeal erythema.  Eyes:     Extraocular Movements: Extraocular movements intact.     Conjunctiva/sclera: Conjunctivae normal.     Pupils: Pupils are equal, round, and reactive to light.  Neck:     Thyroid : No thyromegaly.  Cardiovascular:     Rate and Rhythm: Normal rate and regular rhythm.     Heart sounds: No murmur heard.    No friction rub. No gallop.     Comments: Pulses 2+ at radial, PT, DP bilaterally. No carotid bruit. No peripheral edema Pulmonary:     Effort: Pulmonary effort is normal.     Breath sounds: Normal breath sounds.  Abdominal:     General: Bowel sounds are normal.     Palpations: Abdomen is soft. There is no mass.     Tenderness: There is no abdominal tenderness.  Genitourinary:    Comments: Genital/rectal exam deferred.  Musculoskeletal:     Comments: Full ROM with strength  5/5 bilateral upper and lower extremities  Lymphadenopathy:     Cervical: No cervical adenopathy.  Skin:    General: Skin is warm.     Capillary Refill: Capillary refill takes less than 2 seconds.     Findings: No lesion or rash.  Neurological:     Mental Status: He is alert and oriented to person, place, and time.     Gait: Gait is intact.  Psychiatric:        Mood and Affect: Mood normal.        Behavior: Behavior normal.     Recent Labs     Component Value Date/Time   NA 135 02/28/2023 0911   NA 142 03/30/2022 0925   K 3.9 02/28/2023 0911   CL 101 02/28/2023 0911   CO2 25 02/28/2023 0911   GLUCOSE 101 (H) 02/28/2023 0911   BUN 17 02/28/2023 0911   BUN 16 03/30/2022 0925   CREATININE 0.79 02/28/2023 0911   CALCIUM  9.2 02/28/2023 0911   PROT 7.5 02/28/2023 0911   PROT 7.1 03/30/2022 0925   ALBUMIN 4.4 02/28/2023 0911   ALBUMIN 4.8 03/30/2022 0925   AST 21 02/28/2023 0911   ALT 26 02/28/2023 0911   ALKPHOS 79 02/28/2023 0911   BILITOT 0.4 02/28/2023 0911   BILITOT 0.3 03/30/2022 0925   GFRNONAA >60 02/28/2023 0911   GFRAA 122 01/22/2020 1114    Lab Results  Component Value Date   WBC 8.1 02/28/2023   HGB 16.4 02/28/2023   HCT 49.1 02/28/2023   MCV 82.2 02/28/2023   PLT 298 02/28/2023   Lab Results  Component Value Date   HGBA1C 5.8 (H) 02/28/2023   HGBA1C 6.0 (A) 09/05/2022   HGBA1C 6.0 (H) 03/30/2022   Lab Results  Component Value Date   CHOL 238 (H) 02/28/2023   HDL 34 (L) 02/28/2023   LDLCALC 179 (H) 02/28/2023   TRIG 127 02/28/2023   CHOLHDL 7.0 02/28/2023   Lab Results  Component Value Date   TSH 0.753 09/22/2021        Assessment & Plan Annual physical exam Encouraged healthy lifestyle including regular physical activity and consumption of whole fruits and vegetables. Encouraged routine dental and eye exams.   Orders:   Lipid panel   Hemoglobin A1c   PSA   CBC with Differential/Platelet   Comprehensive metabolic panel with GFR    TSH  Class 3 severe obesity due to excess calories with serious comorbidity and body mass index (BMI) of 40.0 to 44.9 in adult [facility]) Patient with weight gain of about 20 pounds in the last year.  Concern for metabolic comorbidities.  Will check nonfasting labs today.  Very briefly discussed GLP therapy; will wait for labs to result and make our selection accordingly.  Reviewed that patient would need a separate visit for a comprehensive nutrition and activity assessment with review of medication administration and side effects; this visit could be virtual if he so chooses.  Orders:   Lipid panel   Hemoglobin A1c   PSA   CBC with Differential/Platelet   Comprehensive metabolic panel with GFR   TSH  Essential hypertension Elevated x 2 in clinic today, but patient did not take his blood pressure medicine this morning (forgot).  Encouraged medication compliance and home BP monitoring every 1 to 2 weeks. Orders:   lisinopril -hydrochlorothiazide  (ZESTORETIC ) 20-25 MG tablet; Take 1 tablet by mouth daily.   Lipid panel   Hemoglobin A1c   PSA   CBC with Differential/Platelet   Comprehensive metabolic panel with GFR  TSH  Prediabetes Repeat A1c today Orders:   Hemoglobin A1c  Restless leg syndrome Patient would like to try oral iron  supplementation.  Last iron  labs from 2023 did not show an overt iron  deficiency, but iron  was lower end of normal.  Cautioned on potential for constipation, would recommend he take this iron  supplement every 2 to 3 days. Orders:   Iron , Ferrous Sulfate , 325 (65 Fe) MG TABS; Take 325 mg by mouth as directed. Take 2-3 times per week for iron  supplementation. Best absorbed on a mostly empty stomach with a small amount of vitamin C such as an orange or a few sips of juice. If stomach upset occurs, take with food.  Food intolerance in adult Check food allergy profile at patient's request Orders:   Food Allergy Profile  Screening for colon cancer Submitting new  referral for routine colonoscopy Orders:   Ambulatory referral to Gastroenterology  Immunization declined Reviewed patient due for flu, Prevnar 20, Shingrix.  He declines all immunizations today.      Follow-up likely in about 1 to 2 months pending GLP approval Follow-up 1 year CPE   Rolan Hoyle, PA-C, DMSc, DipACLM, Nutritionist [facility] [phone]      [1]  Current Meds  Medication Sig   esomeprazole  (NEXIUM ) 40 MG capsule Take 1 capsule (40 mg total) by mouth daily.   Iron , Ferrous Sulfate , 325 (65 Fe) MG TABS Take 325 mg by mouth as directed. Take 2-3 times per week for iron  supplementation. Best absorbed on a mostly empty stomach with a small amount of vitamin C such as an orange or a few sips of juice. If stomach upset occurs, take with food.   pramipexole  (MIRAPEX ) 1.5 MG tablet Take 3 tablets (4.5 mg total) by mouth at bedtime.   Saccharomyces boulardii (PROBIOTIC) 250 MG CAPS Take 250 mg by mouth 2 (two) times daily.   tadalafil  (CIALIS ) 20 MG tablet TAKE 1/2 TO 1 TABLET(10 TO 20 MG) BY MOUTH EVERY OTHER DAY AS NEEDED FOR ERECTILE DYSFUNCTION   [DISCONTINUED] lisinopril -hydrochlorothiazide  (ZESTORETIC ) 20-25 MG tablet Take 1 tablet by mouth daily.  [2]  Allergies Allergen Reactions   Alpha-Gal Anaphylaxis    Can not eat red meat    Augmentin  [Amoxicillin -Pot Clavulanate] Diarrhea    C diff colitis   Iodine Other (See Comments)   Requip [Ropinirole] Nausea Only   Shellfish Allergy    Codeine  Nausea Only  [3]  Social History Tobacco Use   Smoking status: Former    Current packs/day: 0.00    Average packs/day: 0.3 packs/day for 25.0 years (6.3 ttl pk-yrs)    Types: Cigarettes    Start date: 26    Quit date: 2024    Years since quitting: 2.0   Smokeless tobacco: Never  Vaping Use   Vaping status: Never Used  Substance Use Topics   Alcohol use: Yes    Comment: rare    Drug use: No   "

## 2024-07-24 NOTE — Assessment & Plan Note (Addendum)
 Elevated x 2 in clinic today, but patient did not take his blood pressure medicine this morning (forgot).  Encouraged medication compliance and home BP monitoring every 1 to 2 weeks. Orders:   lisinopril -hydrochlorothiazide  (ZESTORETIC ) 20-25 MG tablet; Take 1 tablet by mouth daily.   Lipid panel   Hemoglobin A1c   PSA   CBC with Differential/Platelet   Comprehensive metabolic panel with GFR   TSH

## 2024-07-24 NOTE — Assessment & Plan Note (Addendum)
 Patient would like to try oral iron  supplementation.  Last iron  labs from 2023 did not show an overt iron  deficiency, but iron  was lower end of normal.  Cautioned on potential for constipation, would recommend he take this iron  supplement every 2 to 3 days. Orders:   Iron , Ferrous Sulfate , 325 (65 Fe) MG TABS; Take 325 mg by mouth as directed. Take 2-3 times per week for iron  supplementation. Best absorbed on a mostly empty stomach with a small amount of vitamin C such as an orange or a few sips of juice. If stomach upset occurs, take with food.

## 2024-07-25 ENCOUNTER — Telehealth: Payer: Self-pay | Admitting: Physician Assistant

## 2024-07-25 NOTE — Telephone Encounter (Signed)
 Good afternoon, please check prior auth for Zepound for this patient with class 3 severe/morbid obesity with serious comorbidity of HTN, HLD, preDM.

## 2024-07-25 NOTE — Telephone Encounter (Signed)
 Please review.  KP

## 2024-07-28 ENCOUNTER — Other Ambulatory Visit (HOSPITAL_COMMUNITY): Payer: Self-pay

## 2024-07-28 ENCOUNTER — Other Ambulatory Visit: Payer: Self-pay | Admitting: Physician Assistant

## 2024-07-28 DIAGNOSIS — E66813 Obesity, class 3: Secondary | ICD-10-CM

## 2024-07-28 MED ORDER — NALTREXONE HCL 50 MG PO TABS: ORAL_TABLET | ORAL | 0 refills | Status: AC

## 2024-07-28 MED ORDER — BUPROPION HCL ER (SR) 100 MG PO TB12: 100.0000 mg | ORAL_TABLET | Freq: Two times a day (BID) | ORAL | 0 refills | Status: AC

## 2024-07-28 NOTE — Telephone Encounter (Signed)
 Mr. Witherington's plan do not cover zepbound

## 2024-07-29 ENCOUNTER — Telehealth: Payer: Self-pay

## 2024-07-29 ENCOUNTER — Other Ambulatory Visit: Payer: Self-pay

## 2024-07-29 ENCOUNTER — Encounter: Payer: Self-pay | Admitting: Physician Assistant

## 2024-07-29 ENCOUNTER — Ambulatory Visit: Payer: Self-pay | Admitting: Physician Assistant

## 2024-07-29 DIAGNOSIS — D72820 Lymphocytosis (symptomatic): Secondary | ICD-10-CM | POA: Insufficient documentation

## 2024-07-29 DIAGNOSIS — Z1211 Encounter for screening for malignant neoplasm of colon: Secondary | ICD-10-CM

## 2024-07-29 DIAGNOSIS — E8881 Metabolic syndrome: Secondary | ICD-10-CM | POA: Insufficient documentation

## 2024-07-29 LAB — ALLERGY PROFILE, FOOD
Allergen Salmon IgE: <0.1 kU/L
Codfish IgE: <0.1 kU/L
F001-IgE Egg White: <0.1 kU/L
F002-IgE Milk: 0.21 kU/L — AB
F004-IgE Wheat: <0.1 kU/L
F010-IgE Sesame Seed: <0.1 kU/L
F017-IgE Hazelnut (Filbert): <0.1 kU/L
F018-IgE Brazil Nut: <0.1 kU/L
F020-IgE Almond: <0.1 kU/L
F202-IgE Cashew Nut: <0.1 kU/L
F256-IgE Walnut: <0.1 kU/L
F416-IgE Tri a 19(w-5 gliadin): <0.1 kU/L
Peanut, IgE: <0.1 kU/L
Scallop IgE: <0.1 kU/L
Shrimp IgE: 0.39 kU/L — AB
Soybean IgE: <0.1 kU/L
Tuna: <0.1 kU/L

## 2024-07-29 LAB — CBC WITH DIFFERENTIAL/PLATELET
Basophils Absolute: 0.1 10*3/uL (ref 0.0–0.2)
Basos: 1 %
EOS (ABSOLUTE): 0.2 10*3/uL (ref 0.0–0.4)
Eos: 2 %
Hematocrit: 50.7 % (ref 37.5–51.0)
Hemoglobin: 16.7 g/dL (ref 13.0–17.7)
Immature Grans (Abs): 0.1 10*3/uL (ref 0.0–0.1)
Immature Granulocytes: 1 %
Lymphocytes Absolute: 3.8 10*3/uL — ABNORMAL HIGH (ref 0.7–3.1)
Lymphs: 34 %
MCH: 27 pg (ref 26.6–33.0)
MCHC: 32.9 g/dL (ref 31.5–35.7)
MCV: 82 fL (ref 79–97)
Monocytes Absolute: 0.7 10*3/uL (ref 0.1–0.9)
Monocytes: 7 %
Neutrophils Absolute: 6.2 10*3/uL (ref 1.4–7.0)
Neutrophils: 55 %
Platelets: 268 10*3/uL (ref 150–450)
RBC: 6.18 x10E6/uL — ABNORMAL HIGH (ref 4.14–5.80)
RDW: 13.9 % (ref 11.6–15.4)
WBC: 11 10*3/uL — ABNORMAL HIGH (ref 3.4–10.8)

## 2024-07-29 LAB — LIPID PANEL
Chol/HDL Ratio: 6.1 ratio — ABNORMAL HIGH (ref 0.0–5.0)
Cholesterol, Total: 263 mg/dL — ABNORMAL HIGH (ref 100–199)
HDL: 43 mg/dL
LDL Chol Calc (NIH): 175 mg/dL — ABNORMAL HIGH (ref 0–99)
Triglycerides: 240 mg/dL — ABNORMAL HIGH (ref 0–149)
VLDL Cholesterol Cal: 45 mg/dL — ABNORMAL HIGH (ref 5–40)

## 2024-07-29 LAB — COMPREHENSIVE METABOLIC PANEL WITH GFR
ALT: 49 [IU]/L — ABNORMAL HIGH (ref 0–44)
AST: 25 [IU]/L (ref 0–40)
Albumin: 4.6 g/dL (ref 4.1–5.1)
Alkaline Phosphatase: 105 [IU]/L (ref 47–123)
BUN/Creatinine Ratio: 16 (ref 9–20)
BUN: 17 mg/dL (ref 6–24)
Bilirubin Total: 0.2 mg/dL (ref 0.0–1.2)
CO2: 25 mmol/L (ref 20–29)
Calcium: 9.9 mg/dL (ref 8.7–10.2)
Chloride: 102 mmol/L (ref 96–106)
Creatinine, Ser: 1.05 mg/dL (ref 0.76–1.27)
Globulin, Total: 2.4 g/dL (ref 1.5–4.5)
Glucose: 98 mg/dL (ref 70–99)
Potassium: 5.2 mmol/L (ref 3.5–5.2)
Sodium: 141 mmol/L (ref 134–144)
Total Protein: 7 g/dL (ref 6.0–8.5)
eGFR: 86 mL/min/{1.73_m2}

## 2024-07-29 LAB — PSA: Prostate Specific Ag, Serum: 2.2 ng/mL (ref 0.0–4.0)

## 2024-07-29 LAB — HEMOGLOBIN A1C
Est. average glucose Bld gHb Est-mCnc: 126 mg/dL
Hgb A1c MFr Bld: 6 % — ABNORMAL HIGH (ref 4.8–5.6)

## 2024-07-29 LAB — TSH: TSH: 0.861 u[IU]/mL (ref 0.450–4.500)

## 2024-07-29 MED ORDER — NA SULFATE-K SULFATE-MG SULF 17.5-3.13-1.6 GM/177ML PO SOLN: 1.0000 | Freq: Once | ORAL | 0 refills | Status: AC

## 2024-07-29 NOTE — Telephone Encounter (Signed)
 Gastroenterology Pre-Procedure Review  Request Date: 08/08/24 Requesting Physician: Dr. Melany  PATIENT REVIEW QUESTIONS: The patient responded to the following health history questions as indicated:    1. Are you having any GI issues? no 2. Do you have a personal history of Polyps? no 3. Do you have a family history of Colon Cancer or Polyps? no 4. Diabetes Mellitus? no 5. Joint replacements in the past 12 months?no 6. Major health problems in the past 3 months?no 7. Any artificial heart valves, MVP, or defibrillator?no    MEDICATIONS & ALLERGIES:    Patient reports the following regarding taking any anticoagulation/antiplatelet therapy:   Plavix, Coumadin, Eliquis, Xarelto, Lovenox, Pradaxa, Brilinta, or Effient? no Aspirin? no  Patient confirms/reports the following medications:  Current Outpatient Medications  Medication Sig Dispense Refill   esomeprazole  (NEXIUM ) 40 MG capsule Take 1 capsule (40 mg total) by mouth daily. 90 capsule 3   pramipexole  (MIRAPEX ) 1.5 MG tablet Take 3 tablets (4.5 mg total) by mouth at bedtime. 270 tablet 3   Saccharomyces boulardii (PROBIOTIC) 250 MG CAPS Take 250 mg by mouth 2 (two) times daily. 60 capsule 0   tadalafil  (CIALIS ) 20 MG tablet TAKE 1/2 TO 1 TABLET(10 TO 20 MG) BY MOUTH EVERY OTHER DAY AS NEEDED FOR ERECTILE DYSFUNCTION 10 tablet 0   buPROPion  ER (WELLBUTRIN  SR) 100 MG 12 hr tablet Take 1 tablet (100 mg total) by mouth 2 (two) times daily. 60 tablet 0   Iron , Ferrous Sulfate , 325 (65 Fe) MG TABS Take 325 mg by mouth as directed. Take 2-3 times per week for iron  supplementation. Best absorbed on a mostly empty stomach with a small amount of vitamin C such as an orange or a few sips of juice. If stomach upset occurs, take with food. 90 tablet 1   lisinopril -hydrochlorothiazide  (ZESTORETIC ) 20-25 MG tablet Take 1 tablet by mouth daily. 90 tablet 1   naltrexone  (DEPADE) 50 MG tablet Take 1/4 tablet (12.5 mg) twice daily for appetite  suppression 30 tablet 0   No current facility-administered medications for this visit.    Patient confirms/reports the following allergies:  Allergies[1]  No orders of the defined types were placed in this encounter.   AUTHORIZATION INFORMATION Primary Insurance: 1D#: Group #:  Secondary Insurance: 1D#: Group #:  SCHEDULE INFORMATION: Date: 08/08/24 Time: Location: [facility]     [1]  Allergies Allergen Reactions   Alpha-Gal Anaphylaxis    Can not eat red meat    Augmentin  [Amoxicillin -Pot Clavulanate] Diarrhea    C diff colitis   Iodine Other (See Comments)   Requip [Ropinirole] Nausea Only   Shellfish Allergy    Codeine  Nausea Only

## 2024-08-08 ENCOUNTER — Ambulatory Visit: Admit: 2024-08-08 | Admitting: Gastroenterology

## 2025-07-27 ENCOUNTER — Encounter: Admitting: Physician Assistant
